# Patient Record
Sex: Female | Born: 1980 | Race: White | Hispanic: No | Marital: Married | Smoking: Never smoker
Health system: Southern US, Community
[De-identification: ages and names within clinical notes are randomized; demographics above are authoritative.]

## PROBLEM LIST (undated history)

## (undated) DIAGNOSIS — F419 Anxiety disorder, unspecified: Secondary | ICD-10-CM

## (undated) DIAGNOSIS — M1990 Unspecified osteoarthritis, unspecified site: Secondary | ICD-10-CM

## (undated) DIAGNOSIS — M5126 Other intervertebral disc displacement, lumbar region: Secondary | ICD-10-CM

## (undated) DIAGNOSIS — K589 Irritable bowel syndrome without diarrhea: Secondary | ICD-10-CM

## (undated) DIAGNOSIS — G629 Polyneuropathy, unspecified: Secondary | ICD-10-CM

## (undated) DIAGNOSIS — K219 Gastro-esophageal reflux disease without esophagitis: Secondary | ICD-10-CM

## (undated) DIAGNOSIS — E079 Disorder of thyroid, unspecified: Secondary | ICD-10-CM

## (undated) HISTORY — PX: BUNIONECTOMY: SHX129

## (undated) HISTORY — PX: BREAST SURGERY: SHX581

## (undated) HISTORY — PX: TONSILLECTOMY: SUR1361

## (undated) HISTORY — PX: COSMETIC SURGERY: SHX468

---

## 2007-08-22 ENCOUNTER — Ambulatory Visit (HOSPITAL_BASED_OUTPATIENT_CLINIC_OR_DEPARTMENT_OTHER): Admission: RE | Admit: 2007-08-22 | Discharge: 2007-08-23 | Payer: Self-pay | Admitting: Plastic Surgery

## 2007-08-22 ENCOUNTER — Encounter (INDEPENDENT_AMBULATORY_CARE_PROVIDER_SITE_OTHER): Payer: Self-pay | Admitting: Plastic Surgery

## 2008-10-04 HISTORY — PX: OTHER SURGICAL HISTORY: SHX169

## 2009-05-08 ENCOUNTER — Ambulatory Visit (HOSPITAL_COMMUNITY): Admission: RE | Admit: 2009-05-08 | Discharge: 2009-05-08 | Payer: Self-pay | Admitting: Podiatry

## 2011-01-10 LAB — HEMOGLOBIN AND HEMATOCRIT, BLOOD
HCT: 35.6 % — ABNORMAL LOW (ref 36.0–46.0)
Hemoglobin: 12.9 g/dL (ref 12.0–15.0)

## 2011-02-16 NOTE — H&P (Signed)
NAME:  Amy Wang, Amy Wang             ACCOUNT NO.:  [id number]   MEDICAL RECORD NO.:  [id number]          PATIENT TYPE:  AMB   LOCATION:  DAY                           FACILITY:  [facility]   PHYSICIAN:  Cody M. Ulice Brilliant, D.P.M.  DATE OF BIRTH:  27-Jun-1981   DATE OF ADMISSION:  DATE OF DISCHARGE:  LH                              HISTORY & PHYSICAL   HISTORY OF PRESENT ILLNESS:  This is a 30 year old white female who has  a painful bunion deformity of her right foot which been progressively  getting more sore over the last several years, now to the point where  all enclosed shoes are uncomfortable.  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  She is also basically on to wearing sandals now.   PAST MEDICAL HISTORY:  Significant for anxiety.   ALLERGIES:  No known drug allergies.   MEDICATION HISTORY:  Includes Xanax.   PAST SURGICAL HISTORY:  Includes previous breast reconstruction surgery  and childbirth.   SOCIAL HISTORY:  Admits to occasional alcohol use.  She denies the use  of tobacco or illegal drugs.  There is a family history of heart disease  with her father.  Her father is also diabetic.   PHYSICAL EXAMINATION:  GENERAL:  Patient is noted to be a friendly,  cooperative, white female who is slightly anxious.  She is noted to have  palpable dorsalis pedis and posterior tibial pulses.  She has a well-  defined hallux valgus deformity of the right foot.  The great toe is  abutting the second toe.  The medial eminence is prominent, palpable and  uncomfortable to deep palpation.  NEUROLOGICAL:  Examination is unremarkable.   RADIOGRAPHIC ANALYSIS:  Reveals an enlarged HAV deformity of the right  foot.  The IM angle is increased as well as the HA angle.  The medial  eminence is prominent.   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   PLAN:  She is to the point now where she does require surgery and all  enclosed shoes are uncomfortable.  She would like to proceed with this  at [facility].  We have this scheduled for Thursday, May 08, 2009,  under monitored anesthesia care.  I have described the procedure, the  anesthesia.  The usual postoperative course.  The possibility of  complications.  Amy Wang has read her consent form, has participated in the  discussion and to my knowledge has understood and signed.      Denny Peon. Ulice Brilliant, D.P.M.  Electronically Signed     CMD/MEDQ  D:  05/07/2009  T:  05/07/2009  Job:  [id number]

## 2011-02-16 NOTE — Op Note (Signed)
NAME:  Amy Wang, Amy Wang             ACCOUNT NO.:  [id number]   MEDICAL RECORD NO.:  [id number]          PATIENT TYPE:  AMB   LOCATION:  DSC                          FACILITY:  [facility]   PHYSICIAN:  Etter Sjogren, M.D.     DATE OF BIRTH:  1981/03/27   DATE OF PROCEDURE:  08/22/2007  DATE OF DISCHARGE:                               OPERATIVE REPORT   PREOPERATIVE DIAGNOSIS:  Macromastia.   POSTOPERATIVE DIAGNOSIS:  Macromastia.   PROCEDURE PERFORMED:  Bilateral breast reduction.   SURGEON:  Etter Sjogren, M.D.   ASSISTANT:  Pleas Patricia, M.D.   ANESTHESIA:  General.   ESTIMATED BLOOD LOSS:  225 mL.   DRAINS:  One Harrison Mons was left on each side.   CLINICAL NOTE:  30 year old woman with a large breasts desires breast  reduction.  Nature of procedure, risks of complications discussed with  her.  She complains of back pain and shoulder pain, neck pain and she  understands the risk of complications and wished to proceed.   DESCRIPTION OF PROCEDURE:  The patient placed full standing position in  holding area and marked for the breast reduction, taken to the operating  room, placed supine. After successful induction of general anesthesia  she was prepped with Betadine and draped with sterile drapes.  8 cm wide  inferior nipple areolar pedicles were designed and a 38-mm marker marked  a new nipple-areolar complex.  Incisions made, pedicles de-  epithelialized and were isolated from surrounding tissues using the  electrocautery, beveling in outward direction medially and laterally in  order to ensure a broader attachment at the level chest wall than was  present at level of the skin.  At the conclusion of this dissection the  nipple complex had excellent color, bright red bleeding around the  periphery consistent with viability.  Resections were performed medial,  central and lateral and a total of 445 grams from the right side 455  grams left side.  This seemed to give her very nice  symmetry.  Thorough  irrigation with saline.  Meticulous hemostasis with electrocautery.  Excellent hemostasis having been achieved the Blake drain was positioned  brought through the lateral aspect of the inframammary crease incision.  The closures with 3-0 Vicryl in interrupted inverted suture at the  inverted T position followed by 3-0 Monocryl interrupted inverted deep  dermal sutures and 4-0 Monocryl running subcuticular suture.  Measurement taken 5 cm up from the primary crease and a 30-mm marker  marked new nipple areolar site.  This tissue was resected.  Hemostasis  electrocautery.  The nipple complex brought through this opening and  found be viable with bright red bleeding around the periphery.  Insetting with 3-0  Monocryl interrupted inverted deep dermal sutures and running 4-0  Monocryl subcuticular suture.  Steri-Strips, Xeroform gauze, dry sterile  dressing and a circumferential Ace wrap applied and she was transferred  to the recovery room in stable condition having tolerated the procedure  well.      Etter Sjogren, M.D.  Electronically Signed     DB/MEDQ  D:  08/22/2007  T:  08/23/2007  Job:  [id number]

## 2011-02-16 NOTE — Op Note (Signed)
NAME:  Amy Wang, Amy Wang             ACCOUNT NO.:  [id number]   MEDICAL RECORD NO.:  [id number]          PATIENT TYPE:  AMB   LOCATION:  DAY                           FACILITY:  [facility]   PHYSICIAN:  Cody M. Ulice Brilliant, D.P.M.  DATE OF BIRTH:  1981-01-14   DATE OF PROCEDURE:  05/08/2009  DATE OF DISCHARGE:  05/08/2009                               OPERATIVE REPORT   PREOPERATIVE DIAGNOSES:  1. Hallux abductovalgus deformity, right foot.  2. Hallux abductus interphalangeal deformity, proximal phalanx right      foot.   POSTOPERATIVE DIAGNOSES:  1. Hallux abductovalgus deformity, right foot.  2. Hallux abductus interphalangeal deformity, proximal phalanx right      foot.   PROCEDURES PERFORMED:  1. Austin bunionectomy right foot.  2. Distal Akin osteotomy, proximal phalanx, right great toe.   SURGEON:  Denny Peon. Ulice Brilliant, DPM   ANESTHESIA:  MAC.   INDICATIONS FOR SURGERY:  Longstanding bunion deformity of right foot  getting progressively worse to the point now where all closed shoes were  uncomfortable.  Amy Wang also relates that the great toe is abutting the  second toe and she has discomfort about the distal medial aspect of the  great toe and she does not like the way the toe looks curving into the  second digit.  Clinically and radiographically, it is evident that she  has deformity in the great toe that is independent of the deformity at  the first MTP.  Radiographs do reveal a lateral bowing of the proximal  phalanx.   DESCRIPTION OF PROCEDURE:  Amy Wang is brought into the OR and placed on  the table in the supine position.  IV sedation was established.  A Mayo  block was performed about the first MTP of this right foot utilizing 16  mL of a 1:1 mixture of lidocaine and Marcaine.  A pneumatic ankle  tourniquet was then applied across her right ankle.  Her foot was  prepped and draped in the usual aseptic fashion.  An Ace bandage was  utilized to exsanguinate her foot.  The tourniquet  is inflated to 250  mmHg.   PROCEDURE:  1. Austin bunionectomy, right foot.  Attention was directed to the      first metatarsophalangeal joint of the right foot.  A 7-cm slightly      curvilinear skin incision was planned first with a skin marking pen      and then carried forth with a #15 blade.  The incision was deepened      through the subcutaneous tissues with care taken to avoid      neurovascular structures.  Care was taken to get a good exposure to      the medial aspect of the joint capsule.  An inverted-L capsulotomy      was then performed.  The capsular periosteal tissues were reflected      away from the medial eminence of the first metatarsal head.  Care      was taken also to get good exposure to the dorsal aspect of the      metatarsal along the dorsal  wing of the Austin osteotomy.  The      medial eminence was delivered from the surgical wound utilizing a      sagittal saw with a #63 blade.  Attention was then directed into      the first interspace through the original skin incision.  Utilizing      an EndoRetractor and Metzenbaum scissors, the dissection was      carried down to the level of the fibular sesamoid and abductor      hallucis tenotomy was then performed a #15 blade.  Tension was then      directed back to the first metatarsal head.  A 0.045 K-wire was      introduced and utilized to act as a pin axis guide.  The Amy Wang      cuts were then made with the sagittal saw utilizing the 63 blade.      The capital fragment was relocated laterally, approximately 30%      across the width of the metatarsal surface.  This amount of      correction was visualized with the Zeiss scan.  This was then      fixated with two 0.045 K-wires driven in a divergent fashion.  Care      was taken that there was no redundant protrusion of the K-wires      through the articular surface.  The osteotomy was deemed stable.      The redundant bone medially was then excised with the  saw and the      newly formed ostia surface was rasped smooth.  The K-wires were      then closed with the metatarsal surface cut and rotated, so that      they lay flush against the metatarsal surface.  The wound was      flushed with copious amounts of irrigant.  Capsular tissues were      then reapproximated across the vertical weighing of the      capsulotomy, redundant medial joint capsule was excised.  The      capsular tissues were then reapproximated and closed with 3-0      Vicryl.  Subcutaneous tissues were reapproximated along the length      of the incision utilizing 4-0 Vicryl in a running horizontal      mattress suture.  Skin was then closed with a subcuticular suture      of 4-0 Vicryl.  2. Distal Akin osteotomy of great toe, right foot.  Attention was then      directed to the medial aspect of the great toe.  A 2.5-cm incision      was made just distal to the IP joint extending approximately to      about the proximal aspect of the proximal phalanx.  The incision      was deepened via sharp and blunt dissection directly to the      proximal phalanx.  Utilizing a Therapist, nutritional, we were able to      visualize the distal surgical neck of the proximal phalanx.  The      first osteotomy cut was made parallel to the IP joint to about the      lateral cortical surface.  The second cut was made oblique to the      first with the base being medial and the apex lateral, delivering a      small wedge away from the bone.  The osteotomy was then further  driven laterally with pressure being put on the osteotomy to close      down the osteotomy with the lateral cortical surface weaken, so      that the osteotomy could be closed down.  The saw was removed and      the wound was flushed.  This osteotomy was then closed with two      crossed K-wires driven in a divergent fashion.  The first being      from dorsal distal medial to plantar lateral proximal.  The second      being from  proximal medial to lateral distal.  The osteotomy was      deemed very stable.  The Zeiss scan was utilized to ensure that the      K-wires were not crossing the IP joint.  Care was taken there was      no redundant protrusion of the K-wires in the proximal aspect.  The      K-wires were bent close to the skin in the distal aspect both      medial and lateral and cut.  Pin caps were applied across this.      The wound was flushed with copious amounts of irrigant.  Deep      fascia was closed with horizontal mattress sutures of 4-0 Vicryl.      Skin was then closed with 4-0 Prolene in a combination of      interrupted and horizontal mattress sutures.  Adaptic dressing was      applied to the K-wire exit sites as well as the incisions.  A dry      sterile compressive dressing then follows.  The tourniquet was then      deflated and the patient was removed off the tourniquet and      draping.  A stockinette dressing was applied over the surgical      dressing and a size medium Cam walker was applied to her foot.   Amy Wang tolerates the anesthesia and procedure well.  She was transported  to [facility] without incident.  While there, a list of written  instructions were all explained to her.  While she is being transported,  I am discussing her procedure and her postop care with her step-father.  A prescription for oxycodone 7.5/500 is dispensed as well as a  prescription for promethazine 25 mg.  She will be seen in 7 days for her  first postop dressing change.      Denny Peon. Ulice Brilliant, D.P.M.  Electronically Signed    CMD/MEDQ  D:  05/09/2009  T:  05/09/2009  Job:  [id number]

## 2011-02-25 ENCOUNTER — Emergency Department (HOSPITAL_COMMUNITY): Payer: 59

## 2011-02-25 ENCOUNTER — Emergency Department (HOSPITAL_COMMUNITY)
Admission: EM | Admit: 2011-02-25 | Discharge: 2011-02-25 | Disposition: A | Discharge status: Home / Self Care | Payer: 59 | Attending: Emergency Medicine | Admitting: Emergency Medicine

## 2011-02-25 DIAGNOSIS — R109 Unspecified abdominal pain: Secondary | ICD-10-CM | POA: Insufficient documentation

## 2011-02-25 DIAGNOSIS — R112 Nausea with vomiting, unspecified: Secondary | ICD-10-CM | POA: Insufficient documentation

## 2011-02-25 DIAGNOSIS — F411 Generalized anxiety disorder: Secondary | ICD-10-CM | POA: Insufficient documentation

## 2011-02-25 DIAGNOSIS — K219 Gastro-esophageal reflux disease without esophagitis: Secondary | ICD-10-CM | POA: Insufficient documentation

## 2011-02-25 DIAGNOSIS — R197 Diarrhea, unspecified: Secondary | ICD-10-CM | POA: Insufficient documentation

## 2011-02-25 LAB — URINALYSIS, ROUTINE W REFLEX MICROSCOPIC
Glucose, UA: NEGATIVE mg/dL
Protein, ur: NEGATIVE mg/dL
Specific Gravity, Urine: 1.025 (ref 1.005–1.030)
pH: 5.5 (ref 5.0–8.0)

## 2011-02-25 LAB — CBC
Hemoglobin: 13.1 g/dL (ref 12.0–15.0)
RBC: 4.12 MIL/uL (ref 3.87–5.11)
WBC: 8.5 10*3/uL (ref 4.0–10.5)

## 2011-02-25 LAB — DIFFERENTIAL
Basophils Absolute: 0 10*3/uL (ref 0.0–0.1)
Basophils Relative: 0 % (ref 0–1)
Neutro Abs: 6.5 10*3/uL (ref 1.7–7.7)
Neutrophils Relative %: 76 % (ref 43–77)

## 2011-02-25 LAB — COMPREHENSIVE METABOLIC PANEL
AST: 19 U/L (ref 0–37)
Albumin: 4.5 g/dL (ref 3.5–5.2)
Chloride: 102 mEq/L (ref 96–112)
Creatinine, Ser: 0.56 mg/dL (ref 0.4–1.2)
GFR calc Af Amer: >60 mL/min (ref >60)
Total Bilirubin: 0.4 mg/dL (ref 0.3–1.2)

## 2011-02-25 MED ORDER — IOHEXOL 300 MG/ML  SOLN
100.0000 mL | Freq: Once | INTRAMUSCULAR | Status: AC | PRN
  Administered 2011-02-25: 100 mL via INTRAVENOUS

## 2011-02-27 LAB — GC/CHLAMYDIA PROBE AMP, GENITAL: Chlamydia, DNA Probe: NEGATIVE

## 2011-07-13 LAB — POCT HEMOGLOBIN-HEMACUE: Operator id: 128471

## 2012-12-16 ENCOUNTER — Ambulatory Visit (INDEPENDENT_AMBULATORY_CARE_PROVIDER_SITE_OTHER): Payer: Commercial Managed Care - PPO | Admitting: Family Medicine

## 2012-12-16 VITALS — BP 120/80 | HR 108 | Temp 98.5°F | Resp 18 | Ht 68.5 in | Wt 224.0 lb

## 2012-12-16 DIAGNOSIS — J020 Streptococcal pharyngitis: Secondary | ICD-10-CM

## 2012-12-16 LAB — POCT CBC
Granulocyte percent: 83.8 %G — AB (ref 37–80)
HCT, POC: 40.3 % (ref 37.7–47.9)
Hemoglobin: 13 g/dL (ref 12.2–16.2)
Lymph, poc: 1.7 (ref 0.6–3.4)
MCH, POC: 31.3 pg — AB (ref 27–31.2)
MCHC: 32.3 g/dL (ref 31.8–35.4)
MCV: 97 fL (ref 80–97)
MID (cbc): 0.4 (ref 0–0.9)
MPV: 7.9 fL (ref 0–99.8)
POC Granulocyte: 11.2 — AB (ref 2–6.9)
POC LYMPH PERCENT: 12.9 %L (ref 10–50)
POC MID %: 3.3 %M (ref 0–12)
Platelet Count, POC: 242 10*3/uL (ref 142–424)
RBC: 4.15 M/uL (ref 4.04–5.48)
RDW, POC: 12.8 %
WBC: 13.4 10*3/uL — AB (ref 4.6–10.2)

## 2012-12-16 MED ORDER — OXYCODONE-ACETAMINOPHEN 5-325 MG PO TABS: 1.0000 | ORAL_TABLET | Freq: Three times a day (TID) | ORAL | Status: DC | PRN

## 2012-12-16 MED ORDER — CEFDINIR 300 MG PO CAPS: 300.0000 mg | ORAL_CAPSULE | Freq: Two times a day (BID) | ORAL | Status: DC

## 2012-12-16 MED ORDER — CEFTRIAXONE SODIUM 1 G IJ SOLR
1.0000 g | Freq: Once | INTRAMUSCULAR | Status: AC
Start: 2012-12-16 — End: 2012-12-16
  Administered 2012-12-16: 1 g via INTRAMUSCULAR

## 2012-12-16 MED ORDER — CEFTRIAXONE SODIUM 1 G IJ SOLR: 1.0000 g | INTRAMUSCULAR | Status: DC

## 2012-12-16 NOTE — Patient Instructions (Addendum)
Strep Infections  Streptococcal (strep) infections are caused by streptococcal germs (bacteria). Strep infections are very contagious. Strep infections can occur in:   Ears.   The nose.   The throat.   Sinuses.   Skin.   Blood.   Lungs.   Spinal fluid.   Urine.  Strep throat is the most common bacterial infection in children. The symptoms of a Strep infection usually get better in 2 to 3 days after starting medicine that kills germs (antibiotics). Strep is usually not contagious after 36 to 48 hours of antibiotic treatment. Strep infections that are not treated can cause serious complications. These include gland infections, throat abscess, rheumatic fever and kidney disease.  DIAGNOSIS   The diagnosis of strep is made by:   A culture for the strep germ.  TREATMENT   These infections require oral antibiotics for a full 10 days, an antibiotic shot or antibiotics given into the vein (intravenous, IV).  HOME CARE INSTRUCTIONS    Be sure to finish all antibiotics even if feeling better.   Only take over-the-counter medicines for pain, discomfort and or fever, as directed by your caregiver.   Close contacts that have a fever, sore throat or illness symptoms should see their caregiver right away.   You or your child may return to work, school or daycare if the fever and pain are better in 2 to 3 days after starting antibiotics.  SEEK MEDICAL CARE IF:    You or your child has an oral temperature above 102 F (38.9 C).   Your baby is older than 3 months with a rectal temperature of 100.5 F (38.1 C) or higher for more than 1 day.   You or your child is not better in 3 days.  SEEK IMMEDIATE MEDICAL CARE IF:    You or your child has an oral temperature above 102 F (38.9 C), not controlled by medicine.   Your baby is older than 3 months with a rectal temperature of 102 F (38.9 C) or higher.   Your baby is 3 months old or younger with a rectal temperature of 100.4 F (38 C) or higher.   There is a  spreading rash.   There is difficulty swallowing or breathing.   There is increased pain or swelling.  Document Released: 10/28/2004 Document Revised: 12/13/2011 Document Reviewed: 08/06/2009  Exit[employer] Patient Information 2013 [employer]

## 2012-12-16 NOTE — Progress Notes (Signed)
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 Patient is in tears complaining of severe pain in her throat. She denies a headache or stiff neck. She also has no cough or high fever.  Objective: Patient acute distress,tearful TMs: Normal Oropharynx: Reddened swollen posterior pharynx with no exudates Neck: Bilateral swollen anterior submandibular nodes which are tender but not fluctuant, neck is supple Chest: Clear: Skin: Warm and dry  Results for orders placed in visit on 12/16/12  POCT CBC      Result Value Range   WBC 13.4 (*) 4.6 - 10.2 K/uL   Lymph, poc 1.7  0.6 - 3.4   POC LYMPH PERCENT 12.9  10 - 50 %L   MID (cbc) 0.4  0 - 0.9   POC MID % 3.3  0 - 12 %M   POC Granulocyte 11.2 (*) 2 - 6.9   Granulocyte percent 83.8 (*) 37 - 80 %G   RBC 4.15  4.04 - 5.48 M/uL   Hemoglobin 13.0  12.2 - 16.2 g/dL   HCT, POC 08.6  57.8 - 47.9 %   MCV 97.0  80 - 97 fL   MCH, POC 31.3 (*) 27 - 31.2 pg   MCHC 32.3  31.8 - 35.4 g/dL   RDW, POC 46.9     Platelet Count, POC 242  142 - 424 K/uL   MPV 7.9  0 - 99.8 fL   Assessment: Progressive sinus congestion with acute pharyngitis and lymphadenopathy in the neck  Plan: Rocephin 1 g IM now Percocet 5 mg dose 325 every 6 when necessary Ceftin here 302 tablets daily Streptococcal sore throat - Plan: POCT CBC, Culture, Group A Strep, cefTRIAXone (ROCEPHIN) injection 1 g, cefdinir (OMNICEF) 300 MG capsule, oxyCODONE-acetaminophen (ROXICET) 5-325 MG per tablet

## 2012-12-18 LAB — CULTURE, GROUP A STREP

## 2013-01-02 DIAGNOSIS — M5126 Other intervertebral disc displacement, lumbar region: Secondary | ICD-10-CM

## 2013-01-02 DIAGNOSIS — M5136 Other intervertebral disc degeneration, lumbar region: Secondary | ICD-10-CM

## 2013-01-02 DIAGNOSIS — M51369 Other intervertebral disc degeneration, lumbar region without mention of lumbar back pain or lower extremity pain: Secondary | ICD-10-CM

## 2013-01-02 HISTORY — DX: Other intervertebral disc degeneration, lumbar region: M51.36

## 2013-01-02 HISTORY — DX: Other intervertebral disc degeneration, lumbar region without mention of lumbar back pain or lower extremity pain: M51.369

## 2013-01-02 HISTORY — DX: Other intervertebral disc displacement, lumbar region: M51.26

## 2013-01-18 ENCOUNTER — Encounter (HOSPITAL_COMMUNITY): Payer: Self-pay | Admitting: *Deleted

## 2013-01-18 ENCOUNTER — Emergency Department (HOSPITAL_COMMUNITY)
Admission: EM | Admit: 2013-01-18 | Discharge: 2013-01-18 | Disposition: A | Discharge status: Home / Self Care | Payer: 59 | Attending: Emergency Medicine | Admitting: Emergency Medicine

## 2013-01-18 ENCOUNTER — Emergency Department (HOSPITAL_COMMUNITY): Payer: 59

## 2013-01-18 DIAGNOSIS — M79609 Pain in unspecified limb: Secondary | ICD-10-CM | POA: Insufficient documentation

## 2013-01-18 DIAGNOSIS — IMO0002 Reserved for concepts with insufficient information to code with codable children: Secondary | ICD-10-CM | POA: Insufficient documentation

## 2013-01-18 DIAGNOSIS — F411 Generalized anxiety disorder: Secondary | ICD-10-CM | POA: Insufficient documentation

## 2013-01-18 DIAGNOSIS — Z3202 Encounter for pregnancy test, result negative: Secondary | ICD-10-CM | POA: Insufficient documentation

## 2013-01-18 DIAGNOSIS — M5416 Radiculopathy, lumbar region: Secondary | ICD-10-CM

## 2013-01-18 DIAGNOSIS — Z79899 Other long term (current) drug therapy: Secondary | ICD-10-CM | POA: Insufficient documentation

## 2013-01-18 HISTORY — DX: Anxiety disorder, unspecified: F41.9

## 2013-01-18 LAB — URINALYSIS, ROUTINE W REFLEX MICROSCOPIC
Bilirubin Urine: NEGATIVE
Glucose, UA: NEGATIVE mg/dL
Hgb urine dipstick: NEGATIVE
Specific Gravity, Urine: <1.005 — ABNORMAL LOW (ref 1.005–1.030)
pH: 5.5 (ref 5.0–8.0)

## 2013-01-18 LAB — CBC WITH DIFFERENTIAL/PLATELET
Eosinophils Absolute: 0.1 10*3/uL (ref 0.0–0.7)
Eosinophils Relative: 1 % (ref 0–5)
Hemoglobin: 12.3 g/dL (ref 12.0–15.0)
Lymphocytes Relative: 34 % (ref 12–46)
Lymphs Abs: 2.8 10*3/uL (ref 0.7–4.0)
MCH: 31.9 pg (ref 26.0–34.0)
MCV: 91.2 fL (ref 78.0–100.0)
Monocytes Relative: 5 % (ref 3–12)
RBC: 3.85 MIL/uL — ABNORMAL LOW (ref 3.87–5.11)

## 2013-01-18 LAB — BASIC METABOLIC PANEL
BUN: 18 mg/dL (ref 6–23)
CO2: 22 mEq/L (ref 19–32)
Calcium: 9.4 mg/dL (ref 8.4–10.5)
GFR calc non Af Amer: >90 mL/min (ref >90)
Glucose, Bld: 93 mg/dL (ref 70–99)
Potassium: 3.9 mEq/L (ref 3.5–5.1)

## 2013-01-18 MED ORDER — HYDROMORPHONE HCL PF 1 MG/ML IJ SOLN
1.0000 mg | Freq: Once | INTRAMUSCULAR | Status: AC
  Administered 2013-01-18: 1 mg via INTRAVENOUS
  Filled 2013-01-18: qty 1

## 2013-01-18 MED ORDER — CYCLOBENZAPRINE HCL 10 MG PO TABS
10.0000 mg | ORAL_TABLET | Freq: Once | ORAL | Status: AC
  Administered 2013-01-18: 10 mg via ORAL
  Filled 2013-01-18: qty 1

## 2013-01-18 MED ORDER — KETOROLAC TROMETHAMINE 30 MG/ML IJ SOLN
30.0000 mg | Freq: Once | INTRAMUSCULAR | Status: AC
  Administered 2013-01-18: 30 mg via INTRAVENOUS
  Filled 2013-01-18: qty 1

## 2013-01-18 MED ORDER — CYCLOBENZAPRINE HCL 10 MG PO TABS: 10.0000 mg | ORAL_TABLET | Freq: Three times a day (TID) | ORAL | Status: DC | PRN

## 2013-01-18 MED ORDER — PREDNISONE 10 MG PO TABS: ORAL_TABLET | ORAL | Status: DC

## 2013-01-18 MED ORDER — ONDANSETRON HCL 4 MG/2ML IJ SOLN
4.0000 mg | Freq: Once | INTRAMUSCULAR | Status: AC
  Administered 2013-01-18: 4 mg via INTRAVENOUS
  Filled 2013-01-18: qty 2

## 2013-01-18 MED ORDER — OXYCODONE-ACETAMINOPHEN 5-325 MG PO TABS: 1.0000 | ORAL_TABLET | ORAL | Status: DC | PRN

## 2013-01-18 NOTE — ED Notes (Signed)
Pt lying on abdomen on arrival to ED. States she is unable to move or walk at present.

## 2013-01-18 NOTE — ED Provider Notes (Signed)
History     CSN: [id number]  Arrival date & time 01/18/13  1747   First MD Initiated Contact with Patient 01/18/13 1755      Chief Complaint  Patient presents with  . Back Pain    (Consider location/radiation/quality/duration/timing/severity/associated sxs/prior treatment) HPI Comments: Patient with hx of occasional low back pain, c/o sudden onset of severe low back pain after she picked up her son.  States she felt a sharp pain to her lower back that radiated into her left anterior thigh and stops at the level of her knee.  Describes the pain as "feels like someone has a knife in my back".  She states that she has occasional episodes of low back pain and has been "down with my back before" but states she has never experienced pain similar to this.  She states the pain is worse with movement and nothing makes it better.  She has not tried any therapies at home.  She denies numbness or weakness of her legs, dysuria, incontinence of bladder or bowel, saddle anesthesia's or pain to her right leg.    Patient is a 32 y.o. female presenting with back pain. The history is provided by the patient.  Back Pain Location:  Lumbar spine Quality:  Shooting and aching Radiates to:  L posterior upper leg, L knee and L thigh Pain severity:  Severe Pain is:  Same all the time Onset quality:  Sudden Timing:  Constant Progression:  Unchanged Chronicity:  New Context: lifting heavy objects   Relieved by:  Nothing Worsened by:  Ambulation, bending, movement, standing, twisting, sitting and palpation Ineffective treatments:  None tried Associated symptoms: leg pain   Associated symptoms: no abdominal pain, no abdominal swelling, no bladder incontinence, no bowel incontinence, no chest pain, no dysuria, no fever, no headaches, no numbness, no paresthesias, no pelvic pain, no perianal numbness, no tingling and no weakness     Past Medical History  Diagnosis Date  . Anxiety     Past Surgical History    Procedure Laterality Date  . Breast surgery    . Cosmetic surgery      Family History  Problem Relation Age of Onset  . Heart disease Father     History  Substance Use Topics  . Smoking status: Never Smoker   . Smokeless tobacco: Not on file  . Alcohol Use: Yes    OB History   Grav Para Term Preterm Abortions TAB SAB Ect Mult Living                  Review of Systems  Constitutional: Negative for fever, activity change and appetite change.  HENT: Negative for neck pain.   Respiratory: Negative for shortness of breath.   Cardiovascular: Negative for chest pain.  Gastrointestinal: Negative for vomiting, abdominal pain, constipation and bowel incontinence.  Genitourinary: Negative for bladder incontinence, dysuria, hematuria, flank pain, decreased urine volume, vaginal bleeding, vaginal discharge, difficulty urinating, vaginal pain and pelvic pain.       No perineal numbness or incontinence of urine or feces  Musculoskeletal: Positive for back pain. Negative for joint swelling.  Skin: Negative for rash.  Neurological: Negative for dizziness, tingling, weakness, numbness, headaches and paresthesias.  Psychiatric/Behavioral: The patient is nervous/anxious.   All other systems reviewed and are negative.    Allergies  Review of patient's allergies indicates no known allergies.  Home Medications   Current Outpatient Rx  Name  Route  Sig  Dispense  Refill  . ALPRAZolam (  XANAX) 1 MG tablet   Oral   Take 1 mg by mouth 3 (three) times daily as needed for anxiety.          . citalopram (CELEXA) 10 MG tablet   Oral   Take 10 mg by mouth daily.           BP 123/86  Pulse 87  Temp(Src) 97.3 F (36.3 C) (Oral)  Resp 20  SpO2 100%  LMP 01/16/2013  Physical Exam  Nursing note and vitals reviewed. Constitutional: She is oriented to person, place, and time. She appears well-developed and well-nourished.  Patient is tearful and appears uncomfortable, lying in prone  position on the stretcher.    HENT:  Head: Normocephalic and atraumatic.  Neck: Normal range of motion. Neck supple.  Cardiovascular: Normal rate, regular rhythm, normal heart sounds and intact distal pulses.   No murmur heard. Pulmonary/Chest: Effort normal and breath sounds normal. No respiratory distress.  Abdominal: Soft. She exhibits no distension. There is no tenderness. There is no rebound.  Genitourinary: Rectal exam shows no external hemorrhoid, no mass, no tenderness and anal tone normal.  Musculoskeletal: She exhibits tenderness. She exhibits no edema.       Lumbar back: She exhibits tenderness and pain. She exhibits normal range of motion, no bony tenderness, no swelling, no deformity, no laceration and normal pulse.       Back:  ttp of the lower lumbar spine , paraspinal muscles and SI joint space.  DP pulses brisk and symmetrical, distal sensation intact.  No calf pain or edema bilaterally  Neurological: She is alert and oriented to person, place, and time. No cranial nerve deficit or sensory deficit. She exhibits normal muscle tone. Coordination and gait normal.  Reflex Scores:      Patellar reflexes are 2+ on the right side and 2+ on the left side.      Achilles reflexes are 2+ on the right side and 2+ on the left side. Skin: Skin is warm and dry.    ED Course  Procedures (including critical care time)  Labs Reviewed  URINALYSIS, ROUTINE W REFLEX MICROSCOPIC - Abnormal; Notable for the following:    Color, Urine STRAW (*)    Specific Gravity, Urine <1.005 (*)    All other components within normal limits  CBC WITH DIFFERENTIAL - Abnormal; Notable for the following:    RBC 3.85 (*)    HCT 35.1 (*)    All other components within normal limits  BASIC METABOLIC PANEL  POCT PREGNANCY, URINE   Dg Lumbar Spine Complete  01/18/2013  *RADIOLOGY REPORT*  Clinical Data: Severe low back pain.  LUMBAR SPINE - COMPLETE 4+ VIEW  Comparison: CT scan of the abdomen and pelvis dated  02/25/2011  Findings: There is no fracture, subluxation, facet arthritis, disc space narrowing, or other abnormality.  IMPRESSION: Normal lumbar spine.   Original Report Authenticated By: Francene Boyers, M.D.         MDM    Patient was given Dilaudid IV en route by EMS, had been given additional dilaudid, zofran, toradol and flexeril during ED stay. Continues to report no relief from the medications.   Pain is reproduced with palp of the lumbar spine and left SI joint with radicular pain to the left leg to level of the knee.  Concerning for radiculopathy, ordered x-ray and labs.   2135  Pt findings discussed with the EDP who will also evaluate the patient.  Attempted to ambulate the patient, but she  only took one step then states her pain is "too bad to walk".   2305  Patient is feeling better.  States that her pain is now "toleratable".  She is moving both LE's w/o difficulty, reflexes are intact and symmetrical, no sensory deficits, able to flex great toe bilaterally, no bladder or bowel incontinence. On initial exam pt c/o pain radiating to her left leg, she now points to her right medial thigh, but is stating the pain is more centered in her lower pelvic area and feels "like a band".  Rectal tone is nml (husband present in the room as chaparone) no rectal masses. Patient was able to elevate her hips off the stretcher for rectal exam.   abd remains soft and NT w/o guarding or rebound.  Care plan discussed with the EDP.  I have arranged for the patient to return here tomorrow at 10:00 am for MRI L spine.  She agrees to care plan and verbalized understanding.     Percocet pre-pack dispensed.  Will prescribe prednisone taper, flexeril and percocet.  Pt also given referral info for [facility].   The patient appears reasonably screened and/or stabilized for discharge and I doubt any other medical condition or other [facility] requiring further screening, evaluation, or treatment in the ED  at this time prior to discharge.    Ioan Landini L. Ashly Yepez, PA-C 01/19/13 0106

## 2013-01-18 NOTE — ED Notes (Signed)
Patient states that pain subsided when she laid down after about 10 minutes.

## 2013-01-18 NOTE — ED Notes (Signed)
Pt was bending over to pick up child and pain began suddenly. Lower back pain radiating down right let. Pt received a total of 2mg  of Dilaudid IV en route by EMS with minimal relief.

## 2013-01-19 ENCOUNTER — Ambulatory Visit (HOSPITAL_COMMUNITY)
Admit: 2013-01-19 | Discharge: 2013-01-19 | Disposition: A | Discharge status: Home / Self Care | Payer: 59 | Source: Ambulatory Visit | Attending: Emergency Medicine | Admitting: Emergency Medicine

## 2013-01-19 DIAGNOSIS — M5126 Other intervertebral disc displacement, lumbar region: Secondary | ICD-10-CM | POA: Insufficient documentation

## 2013-01-19 DIAGNOSIS — M545 Low back pain, unspecified: Secondary | ICD-10-CM | POA: Insufficient documentation

## 2013-01-19 NOTE — ED Provider Notes (Signed)
Pt returned for her MRI this am.  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  Pt very uncomfortable despite meds prescribed here last night.  She was offered re-register here for pain control.  Deferred.  Encouraged call to [facility] for further management of her treatment, referral given last night.  Husband states will call for this referral.  Advised return here for any worsened sx or if pain is not improving at home with rest,  Medications,  Ice/heat therapy.  Burgess Amor, PA-C 01/19/13 1157

## 2013-01-20 NOTE — ED Provider Notes (Signed)
Medical screening examination/treatment/procedure(s) were conducted as a shared visit with non-physician practitioner(s) and myself.  I personally evaluated the patient during the encounter.  Lower back pain with radiation to the left lower extremity. No bowel or bladder incontinence. MRI scheduled for the following day  Donnetta Hutching, MD 01/20/13 0025

## 2013-01-22 MED FILL — Oxycodone w/ Acetaminophen Tab 5-325 MG: ORAL | Qty: 6 | Status: AC

## 2013-01-25 NOTE — ED Provider Notes (Signed)
Medical screening examination/treatment/procedure(s) were conducted as a shared visit with non-physician practitioner(s) and myself.  I personally evaluated the patient during the encounter.  Patient evaluated by self last night.  MRI results as above.  Donnetta Hutching, MD 01/25/13 0800

## 2013-02-21 ENCOUNTER — Other Ambulatory Visit: Payer: Self-pay | Admitting: Neurosurgery

## 2013-02-21 DIAGNOSIS — M549 Dorsalgia, unspecified: Secondary | ICD-10-CM

## 2013-02-23 ENCOUNTER — Other Ambulatory Visit: Payer: Self-pay | Admitting: Neurosurgery

## 2013-02-23 ENCOUNTER — Ambulatory Visit
Admission: RE | Admit: 2013-02-23 | Discharge: 2013-02-23 | Disposition: A | Discharge status: Home / Self Care | Payer: 59 | Source: Ambulatory Visit | Attending: Neurosurgery | Admitting: Neurosurgery

## 2013-02-23 DIAGNOSIS — M549 Dorsalgia, unspecified: Secondary | ICD-10-CM

## 2013-02-23 MED ORDER — IOHEXOL 180 MG/ML  SOLN
1.0000 mL | Freq: Once | INTRAMUSCULAR | Status: AC | PRN
  Administered 2013-02-23: 1 mL via EPIDURAL

## 2013-02-23 MED ORDER — METHYLPREDNISOLONE ACETATE 40 MG/ML INJ SUSP (RADIOLOG
120.0000 mg | Freq: Once | INTRAMUSCULAR | Status: AC
  Administered 2013-02-23: 120 mg via EPIDURAL

## 2013-02-27 ENCOUNTER — Other Ambulatory Visit: Payer: 59

## 2013-09-11 ENCOUNTER — Encounter (INDEPENDENT_AMBULATORY_CARE_PROVIDER_SITE_OTHER): Payer: Self-pay | Admitting: *Deleted

## 2013-10-08 ENCOUNTER — Telehealth (INDEPENDENT_AMBULATORY_CARE_PROVIDER_SITE_OTHER): Payer: Self-pay | Admitting: *Deleted

## 2013-10-08 ENCOUNTER — Other Ambulatory Visit (INDEPENDENT_AMBULATORY_CARE_PROVIDER_SITE_OTHER): Payer: Self-pay | Admitting: *Deleted

## 2013-10-08 ENCOUNTER — Encounter (INDEPENDENT_AMBULATORY_CARE_PROVIDER_SITE_OTHER): Payer: Self-pay | Admitting: *Deleted

## 2013-10-08 DIAGNOSIS — Z8371 Family history of colonic polyps: Secondary | ICD-10-CM

## 2013-10-08 DIAGNOSIS — Z1211 Encounter for screening for malignant neoplasm of colon: Secondary | ICD-10-CM

## 2013-10-08 NOTE — Telephone Encounter (Signed)
Patient needs movi prep 

## 2013-10-09 MED ORDER — PEG-KCL-NACL-NASULF-NA ASC-C 100 G PO SOLR: 1.0000 | Freq: Once | ORAL | Status: DC

## 2013-10-23 ENCOUNTER — Telehealth (INDEPENDENT_AMBULATORY_CARE_PROVIDER_SITE_OTHER): Payer: Self-pay | Admitting: *Deleted

## 2013-10-23 NOTE — Telephone Encounter (Signed)
  Procedure: tcs  Reason/Indication:  Screening, fam hx polyps  Has patient had this procedure before?  no  If so, when, by whom and where?    Is there a family history of colon cancer?  no  Who?  What age when diagnosed?    Is patient diabetic?   no      Does patient have prosthetic heart valve?  no  Do you have a pacemaker?  no  Has patient ever had endocarditis? no  Has patient had joint replacement within last 12 months?  no  Does patient tend to be constipated or take laxatives? no  Is patient on Coumadin, Plavix and/or Aspirin? no  Medications: citalopram 10 mg daily, alprazolam 1 mg up to tid prn, ranitidine 150 mg prn  Allergies: nkda  Medication Adjustment:   Procedure date & time: 11/22/13 at 1030

## 2013-10-23 NOTE — Telephone Encounter (Signed)
agree

## 2013-11-12 ENCOUNTER — Encounter (HOSPITAL_COMMUNITY): Payer: Self-pay | Admitting: Pharmacy Technician

## 2013-11-22 ENCOUNTER — Encounter (HOSPITAL_COMMUNITY)
Admission: RE | Disposition: A | Discharge status: Home / Self Care | Payer: Self-pay | Source: Ambulatory Visit | Attending: Internal Medicine

## 2013-11-22 ENCOUNTER — Encounter (HOSPITAL_COMMUNITY): Payer: Self-pay | Admitting: *Deleted

## 2013-11-22 ENCOUNTER — Ambulatory Visit (HOSPITAL_COMMUNITY)
Admission: RE | Admit: 2013-11-22 | Discharge: 2013-11-22 | Disposition: A | Discharge status: Home / Self Care | Payer: 59 | Source: Ambulatory Visit | Attending: Internal Medicine | Admitting: Internal Medicine

## 2013-11-22 DIAGNOSIS — K644 Residual hemorrhoidal skin tags: Secondary | ICD-10-CM | POA: Insufficient documentation

## 2013-11-22 DIAGNOSIS — Z8371 Family history of colonic polyps: Secondary | ICD-10-CM

## 2013-11-22 DIAGNOSIS — F411 Generalized anxiety disorder: Secondary | ICD-10-CM | POA: Insufficient documentation

## 2013-11-22 DIAGNOSIS — R197 Diarrhea, unspecified: Secondary | ICD-10-CM | POA: Insufficient documentation

## 2013-11-22 DIAGNOSIS — Z1211 Encounter for screening for malignant neoplasm of colon: Secondary | ICD-10-CM

## 2013-11-22 DIAGNOSIS — Z83719 Family history of colon polyps, unspecified: Secondary | ICD-10-CM | POA: Insufficient documentation

## 2013-11-22 HISTORY — DX: Other intervertebral disc displacement, lumbar region: M51.26

## 2013-11-22 HISTORY — DX: Unspecified osteoarthritis, unspecified site: M19.90

## 2013-11-22 HISTORY — DX: Gastro-esophageal reflux disease without esophagitis: K21.9

## 2013-11-22 HISTORY — PX: COLONOSCOPY: SHX5424

## 2013-11-22 SURGERY — COLONOSCOPY
Anesthesia: Moderate Sedation

## 2013-11-22 MED ORDER — MIDAZOLAM HCL 5 MG/5ML IJ SOLN
INTRAMUSCULAR | Status: AC
  Filled 2013-11-22: qty 5

## 2013-11-22 MED ORDER — MIDAZOLAM HCL 5 MG/5ML IJ SOLN
INTRAMUSCULAR | Status: DC | PRN
  Administered 2013-11-22 (×2): 2 mg via INTRAVENOUS
  Administered 2013-11-22: 4 mg via INTRAVENOUS
  Administered 2013-11-22 (×4): 3 mg via INTRAVENOUS

## 2013-11-22 MED ORDER — STERILE WATER FOR IRRIGATION IR SOLN
Status: DC | PRN
  Administered 2013-11-22: 11:00:00

## 2013-11-22 MED ORDER — MIDAZOLAM HCL 5 MG/5ML IJ SOLN
INTRAMUSCULAR | Status: AC
  Filled 2013-11-22: qty 10

## 2013-11-22 MED ORDER — MEPERIDINE HCL 50 MG/ML IJ SOLN
INTRAMUSCULAR | Status: AC
  Filled 2013-11-22: qty 1

## 2013-11-22 MED ORDER — PROMETHAZINE HCL 25 MG/ML IJ SOLN
INTRAMUSCULAR | Status: AC
  Filled 2013-11-22: qty 1

## 2013-11-22 MED ORDER — MEPERIDINE HCL 50 MG/ML IJ SOLN
INTRAMUSCULAR | Status: DC | PRN
  Administered 2013-11-22: 50 mg
  Administered 2013-11-22 (×2): 25 mg

## 2013-11-22 MED ORDER — SODIUM CHLORIDE 0.9 % IJ SOLN
INTRAMUSCULAR | Status: AC
  Filled 2013-11-22: qty 10

## 2013-11-22 MED ORDER — SODIUM CHLORIDE 0.9 % IV SOLN
INTRAVENOUS | Status: DC
  Administered 2013-11-22: 1000 mL via INTRAVENOUS

## 2013-11-22 MED ORDER — PROMETHAZINE HCL 25 MG/ML IJ SOLN
INTRAMUSCULAR | Status: DC | PRN
  Administered 2013-11-22 (×2): 12.5 mg via INTRAVENOUS

## 2013-11-22 MED ORDER — DICYCLOMINE HCL 10 MG PO CAPS: 10.0000 mg | ORAL_CAPSULE | Freq: Three times a day (TID) | ORAL | Status: DC

## 2013-11-22 NOTE — H&P (Signed)
Amy Wang is an 33 y.o. female.   Chief Complaint: Patient is here for colonoscopy. HPI: Patient is 33 year old Caucasian female who has a son age 74 with multiple polyps removed. Patient was therefore advised to get her colon checked. She complains of diarrhea which he's had for at least a year. She passes liquid stools. She has urgency and cramps. She notices blood at times but is only tissue and no frank bleeding. Appetite is good and she has not lost any weight. 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 Family history is negative for colonic polyps or CRC.   Past Medical History  Diagnosis Date  . Anxiety   . GERD (gastroesophageal reflux disease)   . Arthritis     back  . 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 Breast surgery    . Cosmetic surgery    . Bunionectomy Right   . Tummy tuck  2010  . Tonsillectomy      as child    Family History  Problem Relation Age of Onset  . Heart disease Father   . Diabetes type II Father   . Transient ischemic attack Mother   . Colon polyps Son    Social History:  reports that she has never smoked. She does not have any smokeless tobacco history on file. She reports that she drinks alcohol. She reports that she does not use illicit drugs.  Allergies: No Known Allergies  Medications Prior to Admission  Medication Sig Dispense Refill  . ALPRAZolam (XANAX) 1 MG tablet Take 1 mg by mouth 3 (three) times daily as needed for anxiety.       . citalopram (CELEXA) 10 MG tablet Take 10 mg by mouth daily.      . cyclobenzaprine (FLEXERIL) 10 MG tablet Take 10 mg by mouth 3 (three) times daily as needed for muscle spasms.      Marland Kitchen oxyCODONE-acetaminophen (PERCOCET) 7.5-325 MG per tablet Take 1 tablet by mouth every 4 (four) hours as needed for pain.      . peg 3350 powder (MOVIPREP) 100 G SOLR Take 1 kit (200 g total) by mouth once.  1 kit  0  . ranitidine (ZANTAC) 150 MG capsule Take 150  mg by mouth 2 (two) times daily as needed for heartburn.        No results found for this or any previous visit (from the past 48 hour(s)). No results found.  ROS  Blood pressure 139/100, pulse 102, temperature 98.1 F (36.7 C), temperature source Oral, resp. rate 20, height _0  (1.778 m), weight 190 lb (86.183 kg), last menstrual period 11/12/2013, SpO2 99.00%. Physical Exam  Constitutional: She appears well-developed and well-nourished.  HENT:  Mouth/Throat: Oropharynx is clear and moist.  Eyes: Conjunctivae are normal. No scleral icterus.  Neck: No thyromegaly present.  Cardiovascular: Normal rate, regular rhythm and normal heart sounds.   No murmur heard. Respiratory: Effort normal and breath sounds normal.  GI: Soft. She exhibits no distension and no mass. There is no tenderness.  Musculoskeletal: She exhibits no edema.  Lymphadenopathy:    She has no cervical adenopathy.  Neurological: She is alert.  Skin: Skin is warm and dry.     Assessment/Plan History of multiple polyps and a son age 5. Chronic diarrhea. Screening colonoscopy.  REHMAN,NAJEEB U 11/22/2013, 10:21 AM

## 2013-11-22 NOTE — Op Note (Signed)
COLONOSCOPY PROCEDURE REPORT  PATIENT:  Amy Wang  MR#:  [id number] Birthdate:  12/30/1980, 33 y.o., female Endoscopist:  Dr. Rogene Houston, MD Referred By:  Dr. Matthias Hughs, MD  Procedure Date: 11/22/2013  Procedure:   Colonoscopy  Indications:  Patient is 34 year old Caucasian female whose son age 52 was found to have multiple colonic polyps. She was therefore advised to be checked. She says her husband had a colonoscopy and was normal. Family she is negative for CRC. She does history of chronic diarrhea.  Informed Consent:  The procedure and risks were reviewed with the patient and informed consent was obtained.  Medications:  Demerol 100 mg IV Versed 20 mg IV Promethazine 25 mg IV and diluted form.  Description of procedure:  After a digital rectal exam was performed, that colonoscope was advanced from the anus through the rectum and colon to the area of the cecum, ileocecal valve and appendiceal orifice. The cecum was deeply intubated. These structures were well-seen and photographed for the record. From the level of the cecum and ileocecal valve, the scope was slowly and cautiously withdrawn. The mucosal surfaces were carefully surveyed utilizing scope tip to flexion to facilitate fold flattening as needed. The scope was pulled down into the rectum where a thorough exam including retroflexion was performed. Terminal ileum was also examined.  Findings:   Prep excellent. Normal mucosa of terminal ileum. Normal mucosa cecum and rest of the colon. Random biopsies taken for mucosa of sigmoid colon. Normal mucosa of rectum. Small hemorrhoids below the dentate line and two anal papillae.   Therapeutic/Diagnostic Maneuvers Performed:  See above  Complications:  None  Cecal Withdrawal Time:  8 minutes  Impression:  Normal terminal ileoscopy. Normal colonoscopy up except small external hemorrhoids and two anal papilla. Random biopsies taken for mucosa of sigmoid colon  looking for microscopic colitis.  Comment; Suspect patient has IBS; topsy taken from mucosa of sigmoid colon to rule out microscopic colitis.  Recommendations:  Standard instructions given. Cyclomen 10 mg by mouth for each meal. I will contact patient with biopsy results and further recommendations.  Calvert Charland U  11/22/2013 11:09 AM  CC: Dr. Deloria Lair, MD & Dr. Rayne Du ref. provider found

## 2013-11-22 NOTE — Discharge Instructions (Signed)
Resume usual medications and diet. Dicyclomine 10 mg by mouth 30 minutes before each meal daily. No driving for 24 hours. Physician will contact you with biopsy results. Colonoscopy, Care After Refer to this sheet in the next few weeks. These instructions provide you with information on caring for yourself after your procedure. Your health care provider may also give you more specific instructions. Your treatment has been planned according to current medical practices, but problems sometimes occur. Call your health care provider if you have any problems or questions after your procedure. WHAT TO EXPECT AFTER THE PROCEDURE  After your procedure, it is typical to have the following:  A small amount of blood in your stool.  Moderate amounts of gas and mild abdominal cramping or bloating. HOME CARE INSTRUCTIONS  Do not drive, operate machinery, or sign important documents for 24 hours.  You may shower and resume your regular physical activities, but move at a slower pace for the first 24 hours.  Take frequent rest periods for the first 24 hours.  Walk around or put a warm pack on your abdomen to help reduce abdominal cramping and bloating.  Drink enough fluids to keep your urine clear or pale yellow.  You may resume your normal diet as instructed by your health care provider. Avoid heavy or fried foods that are hard to digest.  Avoid drinking alcohol for 24 hours or as instructed by your health care provider.  Only take over-the-counter or prescription medicines as directed by your health care provider.  If a tissue sample (biopsy) was taken during your procedure:  Do not take aspirin or blood thinners for 7 days, or as instructed by your health care provider.  Do not drink alcohol for 7 days, or as instructed by your health care provider.  Eat soft foods for the first 24 hours. SEEK MEDICAL CARE IF: You have persistent spotting of blood in your stool 2 3 days after the  procedure. SEEK IMMEDIATE MEDICAL CARE IF:  You have more than a small spotting of blood in your stool.  You pass large blood clots in your stool.  Your abdomen is swollen (distended).  You have nausea or vomiting.  You have a fever.  You have increasing abdominal pain that is not relieved with medicine. Document Released: 05/04/2004 Document Revised: 07/11/2013 Document Reviewed: 05/28/2013 [facility] Patient Information 2014 [place].

## 2013-11-26 ENCOUNTER — Encounter (HOSPITAL_COMMUNITY): Payer: Self-pay | Admitting: Internal Medicine

## 2013-12-04 ENCOUNTER — Encounter (INDEPENDENT_AMBULATORY_CARE_PROVIDER_SITE_OTHER): Payer: Self-pay | Admitting: *Deleted

## 2013-12-07 ENCOUNTER — Ambulatory Visit (HOSPITAL_COMMUNITY)
Admission: RE | Admit: 2013-12-07 | Discharge: 2013-12-07 | Disposition: A | Discharge status: Home / Self Care | Payer: 59 | Source: Ambulatory Visit | Attending: Neurosurgery | Admitting: Neurosurgery

## 2013-12-07 ENCOUNTER — Other Ambulatory Visit (HOSPITAL_COMMUNITY): Payer: Self-pay | Admitting: Neurosurgery

## 2013-12-07 DIAGNOSIS — M545 Low back pain, unspecified: Secondary | ICD-10-CM | POA: Insufficient documentation

## 2013-12-07 DIAGNOSIS — M549 Dorsalgia, unspecified: Secondary | ICD-10-CM

## 2013-12-07 DIAGNOSIS — M5137 Other intervertebral disc degeneration, lumbosacral region: Secondary | ICD-10-CM | POA: Insufficient documentation

## 2013-12-07 DIAGNOSIS — M51379 Other intervertebral disc degeneration, lumbosacral region without mention of lumbar back pain or lower extremity pain: Secondary | ICD-10-CM | POA: Insufficient documentation

## 2013-12-07 DIAGNOSIS — M5126 Other intervertebral disc displacement, lumbar region: Secondary | ICD-10-CM | POA: Insufficient documentation

## 2013-12-07 DIAGNOSIS — M79609 Pain in unspecified limb: Secondary | ICD-10-CM | POA: Insufficient documentation

## 2014-05-07 ENCOUNTER — Telehealth (INDEPENDENT_AMBULATORY_CARE_PROVIDER_SITE_OTHER): Payer: Self-pay | Admitting: *Deleted

## 2014-05-07 ENCOUNTER — Ambulatory Visit (INDEPENDENT_AMBULATORY_CARE_PROVIDER_SITE_OTHER): Payer: 59 | Admitting: Internal Medicine

## 2014-05-07 NOTE — Telephone Encounter (Signed)
Amy Wang cancelled her apt for today, 05/07/14, with Deberah Castle, NP. She is sick and will call back to reschedule.

## 2014-07-23 ENCOUNTER — Emergency Department (HOSPITAL_COMMUNITY)
Admission: EM | Admit: 2014-07-23 | Discharge: 2014-07-23 | Disposition: A | Discharge status: Home / Self Care | Payer: 59 | Attending: Emergency Medicine | Admitting: Emergency Medicine

## 2014-07-23 ENCOUNTER — Emergency Department (HOSPITAL_COMMUNITY): Payer: 59

## 2014-07-23 ENCOUNTER — Encounter (HOSPITAL_COMMUNITY): Payer: Self-pay | Admitting: Emergency Medicine

## 2014-07-23 DIAGNOSIS — M479 Spondylosis, unspecified: Secondary | ICD-10-CM | POA: Diagnosis not present

## 2014-07-23 DIAGNOSIS — Z3202 Encounter for pregnancy test, result negative: Secondary | ICD-10-CM | POA: Insufficient documentation

## 2014-07-23 DIAGNOSIS — F419 Anxiety disorder, unspecified: Secondary | ICD-10-CM | POA: Insufficient documentation

## 2014-07-23 DIAGNOSIS — Z79899 Other long term (current) drug therapy: Secondary | ICD-10-CM | POA: Diagnosis not present

## 2014-07-23 DIAGNOSIS — K219 Gastro-esophageal reflux disease without esophagitis: Secondary | ICD-10-CM | POA: Diagnosis not present

## 2014-07-23 DIAGNOSIS — K589 Irritable bowel syndrome without diarrhea: Secondary | ICD-10-CM

## 2014-07-23 DIAGNOSIS — R1010 Upper abdominal pain, unspecified: Secondary | ICD-10-CM | POA: Diagnosis present

## 2014-07-23 DIAGNOSIS — R109 Unspecified abdominal pain: Secondary | ICD-10-CM

## 2014-07-23 HISTORY — DX: Irritable bowel syndrome, unspecified: K58.9

## 2014-07-23 LAB — URINALYSIS, ROUTINE W REFLEX MICROSCOPIC
Bilirubin Urine: NEGATIVE
GLUCOSE, UA: NEGATIVE mg/dL
HGB URINE DIPSTICK: NEGATIVE
Ketones, ur: NEGATIVE mg/dL
LEUKOCYTES UA: NEGATIVE
Nitrite: NEGATIVE
PH: 5.5 (ref 5.0–8.0)
PROTEIN: NEGATIVE mg/dL
Specific Gravity, Urine: 1.015 (ref 1.005–1.030)
Urobilinogen, UA: 0.2 mg/dL (ref 0.0–1.0)

## 2014-07-23 LAB — CBC WITH DIFFERENTIAL/PLATELET
Basophils Absolute: 0.1 10*3/uL (ref 0.0–0.1)
Basophils Relative: 1 % (ref 0–1)
Eosinophils Absolute: 1 10*3/uL — ABNORMAL HIGH (ref 0.0–0.7)
Eosinophils Relative: 9 % — ABNORMAL HIGH (ref 0–5)
HCT: 37.9 % (ref 36.0–46.0)
HEMOGLOBIN: 13.4 g/dL (ref 12.0–15.0)
Lymphocytes Relative: 35 % (ref 12–46)
Lymphs Abs: 3.8 10*3/uL (ref 0.7–4.0)
MCH: 31.8 pg (ref 26.0–34.0)
MCHC: 35.4 g/dL (ref 30.0–36.0)
MCV: 90 fL (ref 78.0–100.0)
MONO ABS: 0.7 10*3/uL (ref 0.1–1.0)
Monocytes Relative: 7 % (ref 3–12)
NEUTROS ABS: 5.1 10*3/uL (ref 1.7–7.7)
NEUTROS PCT: 48 % (ref 43–77)
Platelets: 280 10*3/uL (ref 150–400)
RBC: 4.21 MIL/uL (ref 3.87–5.11)
RDW: 12 % (ref 11.5–15.5)
WBC: 10.7 10*3/uL — AB (ref 4.0–10.5)

## 2014-07-23 LAB — PREGNANCY, URINE: PREG TEST UR: NEGATIVE

## 2014-07-23 LAB — COMPREHENSIVE METABOLIC PANEL
ALT: 26 U/L (ref 0–35)
ANION GAP: 10 (ref 5–15)
AST: 22 U/L (ref 0–37)
Albumin: 4.3 g/dL (ref 3.5–5.2)
Alkaline Phosphatase: 108 U/L (ref 39–117)
BILIRUBIN TOTAL: 0.3 mg/dL (ref 0.3–1.2)
BUN: 11 mg/dL (ref 6–23)
CHLORIDE: 101 meq/L (ref 96–112)
CO2: 29 mEq/L (ref 19–32)
Calcium: 9.8 mg/dL (ref 8.4–10.5)
Creatinine, Ser: 0.72 mg/dL (ref 0.50–1.10)
GFR calc non Af Amer: >90 mL/min (ref >90)
GLUCOSE: 100 mg/dL — AB (ref 70–99)
POTASSIUM: 5.1 meq/L (ref 3.7–5.3)
SODIUM: 140 meq/L (ref 137–147)
TOTAL PROTEIN: 8 g/dL (ref 6.0–8.3)

## 2014-07-23 LAB — AMYLASE: Amylase: 38 U/L (ref 0–105)

## 2014-07-23 LAB — LIPASE, BLOOD: Lipase: 31 U/L (ref 11–59)

## 2014-07-23 MED ORDER — MORPHINE SULFATE 4 MG/ML IJ SOLN
4.0000 mg | INTRAMUSCULAR | Status: DC | PRN
  Administered 2014-07-23: 4 mg via INTRAVENOUS
  Filled 2014-07-23: qty 1

## 2014-07-23 MED ORDER — IOHEXOL 300 MG/ML  SOLN
25.0000 mL | Freq: Once | INTRAMUSCULAR | Status: AC | PRN
  Administered 2014-07-23: 25 mL via ORAL

## 2014-07-23 MED ORDER — ONDANSETRON 4 MG PO TBDP: 4.0000 mg | ORAL_TABLET | Freq: Three times a day (TID) | ORAL | Status: DC | PRN

## 2014-07-23 MED ORDER — HYOSCYAMINE SULFATE 0.125 MG SL SUBL: 0.1250 mg | SUBLINGUAL_TABLET | Freq: Four times a day (QID) | SUBLINGUAL | Status: DC | PRN

## 2014-07-23 MED ORDER — ONDANSETRON HCL 4 MG/2ML IJ SOLN
4.0000 mg | Freq: Once | INTRAMUSCULAR | Status: AC
  Administered 2014-07-23: 4 mg via INTRAVENOUS
  Filled 2014-07-23: qty 2

## 2014-07-23 MED ORDER — GLYCOPYRROLATE 0.2 MG/ML IJ SOLN
0.3000 mg | Freq: Once | INTRAMUSCULAR | Status: AC
  Administered 2014-07-23: 0.3 mg via INTRAVENOUS
  Filled 2014-07-23: qty 2

## 2014-07-23 MED ORDER — IOHEXOL 300 MG/ML  SOLN
100.0000 mL | Freq: Once | INTRAMUSCULAR | Status: AC | PRN
  Administered 2014-07-23: 100 mL via INTRAVENOUS

## 2014-07-23 MED ORDER — HYDROCODONE-ACETAMINOPHEN 5-325 MG PO TABS: 2.0000 | ORAL_TABLET | ORAL | Status: DC | PRN

## 2014-07-23 NOTE — ED Notes (Signed)
MD at bedside. 

## 2014-07-23 NOTE — ED Provider Notes (Signed)
CSN: [id number]     Arrival date & time 07/23/14  1459 History  This chart was scribed for Amy Furry, MD by Evelene Croon, ED Scribe. This patient was seen in room [facility]07 and the patient's care was started 5:23 PM.    Chief Complaint  Patient presents with  . Abdominal Pain      The history is provided by the patient. No language interpreter was used.    HPI Comments:  Amy Wang is a 33 y.o. female who presents to the Emergency Department complaining of intermittent upper abdominal pain for about 2 weeks, which has been more frequent for the past 4 days. She reports associated nausea and diarrhea. She likens the pain to contractions and notes that the pain waxes and wanes in severity. She notes pain is exacerbated whenever she eats but has not noticed a pattern with greasy foods, states pain occurs when she eats anything. She had a similar episode about 1 year ago, was told by PCP it may have been IBS but was not officially diagnosed. She denies fever and vomiting.  She has been taking bentyl without relief. She also denies a h/o cholecystectomy and appendectomy.    Past Medical History  Diagnosis Date  . Anxiety   . GERD (gastroesophageal reflux disease)   . Arthritis     back  . Bulging lumbar disc 01/2013  . IBS (irritable bowel syndrome)    Past Surgical History  Procedure Laterality Date  . Breast surgery    . Cosmetic surgery    . Bunionectomy Right   . Tummy tuck  2010  . Tonsillectomy      as child  . Colonoscopy N/A 11/22/2013    Procedure: COLONOSCOPY;  Surgeon: Rogene Houston, MD;  Location: [facility];  Service: Endoscopy;  Laterality: N/A;  1030   Family History  Problem Relation Age of Onset  . Heart disease Father   . Diabetes type II Father   . Transient ischemic attack Mother   . Colon polyps Son    History  Substance Use Topics  . Smoking status: Never Smoker   . Smokeless tobacco: Not on file  . Alcohol Use: Yes     Comment: social    OB History   Grav Para Term Preterm Abortions TAB SAB Ect Mult Living                 Review of Systems  Constitutional: Negative for fever.  Gastrointestinal: Positive for nausea, abdominal pain and diarrhea. Negative for vomiting.  All other systems reviewed and are negative.     Allergies  Scopolamine  Home Medications   Prior to Admission medications   Medication Sig Start Date End Date Taking? Authorizing Provider  ALPRAZolam Duanne Moron) 1 MG tablet Take 1 mg by mouth 3 (three) times daily as needed for anxiety.    Yes Historical Provider, MD  CALCIUM PO Take 2 tablets by mouth every morning.   Yes Historical Provider, MD  cyclobenzaprine (FLEXERIL) 10 MG tablet Take 10 mg by mouth 3 (three) times daily as needed for muscle spasms.   Yes Historical Provider, MD  dicyclomine (BENTYL) 10 MG capsule Take 1 capsule (10 mg total) by mouth 3 (three) times daily before meals. 11/22/13  Yes Rogene Houston, MD  DULoxetine (CYMBALTA) 30 MG capsule Take 90 mg by mouth every morning.   Yes Historical Provider, MD  L-LYSINE PO Take 2 tablets by mouth every morning.   Yes Historical Provider,  MD  metoprolol succinate (TOPROL-XL) 25 MG 24 hr tablet Take 25 mg by mouth daily.   Yes Historical Provider, MD  Multiple Vitamin (MULTIVITAMIN WITH MINERALS) TABS tablet Take 1 tablet by mouth daily.   Yes Historical Provider, MD  oxyCODONE-acetaminophen (PERCOCET) 7.5-325 MG per tablet Take 1 tablet by mouth every 4 (four) hours as needed for pain.   Yes Historical Provider, MD  ranitidine (ZANTAC) 150 MG capsule Take 150 mg by mouth 2 (two) times daily as needed for heartburn.   Yes Historical Provider, MD  HYDROcodone-acetaminophen (NORCO/VICODIN) 5-325 MG per tablet Take 2 tablets by mouth every 4 (four) hours as needed. 07/23/14   Amy Furry, MD  hyoscyamine (LEVSIN/SL) 0.125 MG SL tablet Place 1 tablet (0.125 mg total) under the tongue every 6 (six) hours as needed for cramping. 07/23/14   Amy Furry, MD  ondansetron (ZOFRAN ODT) 4 MG disintegrating tablet Take 1 tablet (4 mg total) by mouth every 8 (eight) hours as needed for nausea. 07/23/14   Amy Furry, MD   BP 124/71  Pulse 86  Temp(Src) 99 F (37.2 C) (Oral)  Ht 5\' 9"  (1.753 m)  Wt 230 lb (104.327 kg)  BMI 33.95 kg/m2  SpO2 100%  LMP 07/13/2014 Physical Exam  Nursing note and vitals reviewed. Constitutional: She is oriented to person, place, and time. She appears well-developed and well-nourished. No distress.  HENT:  Head: Normocephalic.  Eyes: Conjunctivae are normal. Pupils are equal, round, and reactive to light. No scleral icterus.  Neck: Normal range of motion. Neck supple. No thyromegaly present.  Cardiovascular: Normal rate and regular rhythm.  Exam reveals no gallop and no friction rub.   No murmur heard. Pulmonary/Chest: Effort normal and breath sounds normal. No respiratory distress. She has no wheezes. She has no rales.  Abdominal: Soft. Bowel sounds are normal. She exhibits no distension. There is no tenderness. There is no rebound.  Indicates upper abdomen bilaterally. Non-tender on exam  Musculoskeletal: Normal range of motion.  Neurological: She is alert and oriented to person, place, and time.  Skin: Skin is warm and dry. No rash noted.  Psychiatric: She has a normal mood and affect. Her behavior is normal.    ED Course  Procedures  DIAGNOSTIC STUDIES:  Oxygen Saturation is 99% on RA, normal by my interpretation.    COORDINATION OF CARE:  5:33 PM Discussed treatment plan with pt at bedside and pt agreed to plan.  Labs Review Labs Reviewed  CBC WITH DIFFERENTIAL - Abnormal; Notable for the following:    WBC 10.7 (*)    Eosinophils Relative 9 (*)    Eosinophils Absolute 1.0 (*)    All other components within normal limits  COMPREHENSIVE METABOLIC PANEL - Abnormal; Notable for the following:    Glucose, Bld 100 (*)    All other components within normal limits  URINALYSIS, ROUTINE W  REFLEX MICROSCOPIC - Abnormal; Notable for the following:    APPearance HAZY (*)    All other components within normal limits  AMYLASE  LIPASE, BLOOD  PREGNANCY, URINE    Imaging Review Ct Abdomen Pelvis W Contrast  07/23/2014   CLINICAL DATA:  Mid abdominal pain for 1 week and have. Worsening abdominal pain today, diarrhea for 2 days  EXAM: CT ABDOMEN AND PELVIS WITH CONTRAST  TECHNIQUE: Multidetector CT imaging of the abdomen and pelvis was performed using the standard protocol following bolus administration of intravenous contrast.  CONTRAST:  68mL OMNIPAQUE IOHEXOL 300 MG/ML SOLN, 157mL OMNIPAQUE IOHEXOL  300 MG/ML SOLN  COMPARISON:  02/25/2011  FINDINGS: Lung bases are unremarkable. Sagittal images of the spine shows disc space flattening with vacuum disc phenomenon at L5-S1 level. Mild hepatic fatty infiltration. Gallbladder is contracted without evidence of calcified gallstones. The pancreas, spleen and adrenal glands are unremarkable. Kidneys are symmetrical in size and enhancement. No hydronephrosis or hydroureter.  Abdominal aorta is unremarkable.  No small bowel obstruction.  No ascites or free air.  No adenopathy.  There is no pericecal inflammation. Normal appendix. The terminal ileum is unremarkable.  The uterus is unremarkable. No adnexal masses noted. The urinary bladder is unremarkable. No destructive bony lesions are noted within pelvis. No inguinal adenopathy.  IMPRESSION: 1. No acute inflammatory process within abdomen or pelvis. 2. Mild hepatic fatty infiltration. 3. No pericecal inflammation.  Normal appendix. 4. No hydronephrosis or hydroureter.   Electronically Signed   By: Lahoma Crocker M.D.   On: 07/23/2014 19:14     EKG Interpretation None      MDM   Final diagnoses:  Abdominal pain, unspecified abdominal location  Irritable bowel syndrome   Patient improved greatly with it is spasmodic. CT scan shows no structural abnormalities. No elevation of white blood cell count.  Normal hepatobiliary and pancreatic enzymes. Normal urine. Plan GI followup. Fiber. Levsin for irritible bowel syndrome.  I personally performed the services described in this documentation, which was scribed in my presence. The recorded information has been reviewed and is accurate.    Amy Furry, MD 07/23/14 2027

## 2014-07-23 NOTE — Discharge Instructions (Signed)
Abdominal Pain Many things can cause abdominal pain. Usually, abdominal pain is not caused by a disease and will improve without treatment. It can often be observed and treated at home. Your health care provider will do a physical exam and possibly order blood tests and X-rays to help determine the seriousness of your pain. However, in many cases, more time must pass before a clear cause of the pain can be found. Before that point, your health care provider may not know if you need more testing or further treatment. HOME CARE INSTRUCTIONS  Monitor your abdominal pain for any changes. The following actions may help to alleviate any discomfort you are experiencing:  Only take over-the-counter or prescription medicines as directed by your health care provider.  Do not take laxatives unless directed to do so by your health care provider.  Try a clear liquid diet (broth, tea, or water) as directed by your health care provider. Slowly move to a bland diet as tolerated. SEEK MEDICAL CARE IF:  You have unexplained abdominal pain.  You have abdominal pain associated with nausea or diarrhea.  You have pain when you urinate or have a bowel movement.  You experience abdominal pain that wakes you in the night.  You have abdominal pain that is worsened or improved by eating food.  You have abdominal pain that is worsened with eating fatty foods.  You have a fever. SEEK IMMEDIATE MEDICAL CARE IF:   Your pain does not go away within 2 hours.  You keep throwing up (vomiting).  Your pain is felt only in portions of the abdomen, such as the right side or the left lower portion of the abdomen.  You pass bloody or black tarry stools. MAKE SURE YOU:  Understand these instructions.   Will watch your condition.   Will get help right away if you are not doing well or get worse.  Document Released: 06/30/2005 Document Revised: 09/25/2013 Document Reviewed: 05/30/2013 [employer] Patient Information  2015 [employer]. This information is not intended to replace advice given to you by your health care provider. Make sure you discuss any questions you have with your health care provider.   Irritable Bowel Syndrome Irritable bowel syndrome (IBS) is caused by a disturbance of normal bowel function and is a common digestive disorder. You may also hear this condition called spastic colon, mucous colitis, and irritable colon. There is no cure for IBS. However, symptoms often gradually improve or disappear with a good diet, stress management, and medicine. This condition usually appears in late adolescence or early adulthood. Women develop it twice as often as men. CAUSES  After food has been digested and absorbed in the small intestine, waste material is moved into the large intestine, or colon. In the colon, water and salts are absorbed from the undigested products coming from the small intestine. The remaining residue, or fecal material, is held for elimination. Under normal circumstances, gentle, rhythmic contractions of the bowel walls push the fecal material along the colon toward the rectum. In IBS, however, these contractions are irregular and poorly coordinated. The fecal material is either retained too long, resulting in constipation, or expelled too soon, producing diarrhea. SIGNS AND SYMPTOMS  The most common symptom of IBS is abdominal pain. It is often in the lower left side of the abdomen, but it may occur anywhere in the abdomen. The pain comes from spasms of the bowel muscles happening too much and from the buildup of gas and fecal material in the colon.   This pain:  Can range from sharp abdominal cramps to a dull, continuous ache.  Often worsens soon after eating.  Is often relieved by having a bowel movement or passing gas. Abdominal pain is usually accompanied by constipation, but it may also produce diarrhea. The diarrhea often occurs right after a meal or upon waking up in the  morning. The stools are often soft, watery, and flecked with mucus. Other symptoms of IBS include:  Bloating.  Loss of appetite.  Heartburn.  Backache.  Dull pain in the arms or shoulders.  Nausea.  Burping.  Vomiting.  Gas. IBS may also cause symptoms that are unrelated to the digestive system, such as:  Fatigue.  Headaches.  Anxiety.  Shortness of breath.  Trouble concentrating.  Dizziness. These symptoms tend to come and go. DIAGNOSIS  The symptoms of IBS may seem like symptoms of other, more serious digestive disorders. Your health care provider may want to perform tests to exclude these disorders.  TREATMENT Many medicines are available to help correct bowel function or relieve bowel spasms and abdominal pain. Among the medicines available are:  Laxatives for severe constipation and to help restore normal bowel habits.  Specific antidiarrheal medicines to treat severe or lasting diarrhea.  Antispasmodic agents to relieve intestinal cramps. Your health care provider may also decide to treat you with a mild tranquilizer or sedative during unusually stressful periods in your life. Your health care provider may also prescribe antidepressant medicine. The use of this medicine has been shown to reduce pain and other symptoms of IBS. Remember that if any medicine is prescribed for you, you should take it exactly as directed. Make sure your health care provider knows how well it worked for you. HOME CARE INSTRUCTIONS   Take all medicines as directed by your health care provider.  Avoid foods that are high in fat or oils, such as heavy cream, butter, frankfurters, sausage, and other fatty meats.  Avoid foods that make you go to the bathroom, such as fruit, fruit juice, and dairy products.  Cut out carbonated drinks, chewing gum, and "gassy" foods such as beans and cabbage. This may help relieve bloating and burping.  Eat foods with bran, and drink plenty of liquids  with the bran foods. This helps relieve constipation.  Keep track of what foods seem to bring on your symptoms.  Avoid emotionally charged situations or circumstances that produce anxiety.  Start or continue exercising.  Get plenty of rest and sleep. Document Released: 09/20/2005 Document Revised: 09/25/2013 Document Reviewed: 05/10/2008 [employer] Patient Information 2015 [employer]. This information is not intended to replace advice given to you by your health care provider. Make sure you discuss any questions you have with your health care provider.  

## 2014-07-23 NOTE — ED Notes (Signed)
Mid abd pain for 1.5 weeks, worse today, nausea, no vomiting. Diarrhea for last 2 days, a lot of gas.

## 2014-07-23 NOTE — ED Notes (Signed)
Pt alert & oriented x4, stable gait. Patient given discharge instructions, paperwork & prescription(s). Patient  instructed to stop at the registration desk to finish any additional paperwork. Patient verbalized understanding. Pt left department w/ no further questions. 

## 2014-09-02 ENCOUNTER — Emergency Department (HOSPITAL_COMMUNITY): Payer: 59

## 2014-09-02 ENCOUNTER — Encounter (HOSPITAL_COMMUNITY): Payer: Self-pay | Admitting: *Deleted

## 2014-09-02 ENCOUNTER — Emergency Department (HOSPITAL_COMMUNITY)
Admission: EM | Admit: 2014-09-02 | Discharge: 2014-09-02 | Disposition: A | Discharge status: Home / Self Care | Payer: 59 | Attending: Emergency Medicine | Admitting: Emergency Medicine

## 2014-09-02 DIAGNOSIS — Y998 Other external cause status: Secondary | ICD-10-CM | POA: Diagnosis not present

## 2014-09-02 DIAGNOSIS — M1990 Unspecified osteoarthritis, unspecified site: Secondary | ICD-10-CM | POA: Diagnosis not present

## 2014-09-02 DIAGNOSIS — F419 Anxiety disorder, unspecified: Secondary | ICD-10-CM | POA: Insufficient documentation

## 2014-09-02 DIAGNOSIS — S99912A Unspecified injury of left ankle, initial encounter: Secondary | ICD-10-CM | POA: Diagnosis present

## 2014-09-02 DIAGNOSIS — Y9389 Activity, other specified: Secondary | ICD-10-CM | POA: Insufficient documentation

## 2014-09-02 DIAGNOSIS — K219 Gastro-esophageal reflux disease without esophagitis: Secondary | ICD-10-CM | POA: Diagnosis not present

## 2014-09-02 DIAGNOSIS — W19XXXA Unspecified fall, initial encounter: Secondary | ICD-10-CM

## 2014-09-02 DIAGNOSIS — Z79899 Other long term (current) drug therapy: Secondary | ICD-10-CM | POA: Insufficient documentation

## 2014-09-02 DIAGNOSIS — M5416 Radiculopathy, lumbar region: Secondary | ICD-10-CM | POA: Diagnosis not present

## 2014-09-02 DIAGNOSIS — Y929 Unspecified place or not applicable: Secondary | ICD-10-CM | POA: Insufficient documentation

## 2014-09-02 DIAGNOSIS — S93402A Sprain of unspecified ligament of left ankle, initial encounter: Secondary | ICD-10-CM | POA: Diagnosis not present

## 2014-09-02 DIAGNOSIS — T1490XA Injury, unspecified, initial encounter: Secondary | ICD-10-CM

## 2014-09-02 DIAGNOSIS — Z23 Encounter for immunization: Secondary | ICD-10-CM | POA: Diagnosis not present

## 2014-09-02 DIAGNOSIS — W1839XA Other fall on same level, initial encounter: Secondary | ICD-10-CM | POA: Diagnosis not present

## 2014-09-02 LAB — POC URINE PREG, ED: Preg Test, Ur: NEGATIVE

## 2014-09-02 MED ORDER — IBUPROFEN 600 MG PO TABS: 600.0000 mg | ORAL_TABLET | Freq: Four times a day (QID) | ORAL | Status: DC | PRN

## 2014-09-02 MED ORDER — HYDROMORPHONE HCL 1 MG/ML IJ SOLN
1.0000 mg | Freq: Once | INTRAMUSCULAR | Status: AC
  Administered 2014-09-02: 1 mg via INTRAMUSCULAR
  Filled 2014-09-02: qty 1

## 2014-09-02 MED ORDER — DEXAMETHASONE SODIUM PHOSPHATE 10 MG/ML IJ SOLN
10.0000 mg | Freq: Once | INTRAMUSCULAR | Status: AC
  Administered 2014-09-02: 10 mg via INTRAMUSCULAR
  Filled 2014-09-02: qty 1

## 2014-09-02 MED ORDER — HYDROMORPHONE HCL 2 MG/ML IJ SOLN
2.0000 mg | Freq: Once | INTRAMUSCULAR | Status: AC
  Administered 2014-09-02: 2 mg via INTRAMUSCULAR
  Filled 2014-09-02: qty 1

## 2014-09-02 MED ORDER — OXYCODONE-ACETAMINOPHEN 10-325 MG PO TABS: 1.0000 | ORAL_TABLET | ORAL | Status: DC | PRN

## 2014-09-02 MED ORDER — OXYCODONE-ACETAMINOPHEN 5-325 MG PO TABS
1.0000 | ORAL_TABLET | Freq: Once | ORAL | Status: AC
  Administered 2014-09-02: 1 via ORAL
  Filled 2014-09-02: qty 1

## 2014-09-02 NOTE — ED Provider Notes (Signed)
CSN: [id number]     Arrival date & time 09/02/14  4098 History   First MD Initiated Contact with Patient 09/02/14 [phone]     Chief Complaint  Patient presents with  . Ankle Injury     (Consider location/radiation/quality/duration/timing/severity/associated sxs/prior Treatment) Patient is a 33 y.o. female presenting with lower extremity injury. The history is provided by the patient.  Ankle Injury Associated symptoms include arthralgias and joint swelling. Pertinent negatives include no abdominal pain, chest pain, fever, numbness, rash or weakness.   Amy Wang is a 33 y.o. female presenting with left ankle pain which occurred suddenly when she twisted the ankle while walking this am. She describes chronic low back pain with intermittent flares which cause severe pain with radiation into the left leg.  This am she was walking in her garage with a backpack on her back getting ready for work when she had a sharp stab of low back pain causing her to fall with left ankle inversion.    Pain in both her back and her ankle is sharp, constant and worse with palpation, movement and weight bearing.  The patient was able to weight bear immediately after the event.  There is no radiation of pain and the patient denies new numbness distal to the injury site, although reports chronic numbness in her left great toe, not worsened today.  She denies urinary or fecal retention or incontinence.  She has had no medication prior to arrival.  She has been evaluated by Dr Joya Salm for her back issues, diagnosed with degenerative lumbar disease but was determined to not be a surgical candidate due to her young age.  Pt is very tearful and frustrated due to her chronic pain and inability to get assistance with this problem.  She has not discussed this problem with her pcp.  She last saw Dr. Joya Salm last spring.   Past Medical History  Diagnosis Date  . Anxiety   . GERD (gastroesophageal reflux disease)   . Arthritis      back  . Bulging lumbar disc 01/2013  . IBS (irritable bowel syndrome)    Past Surgical History  Procedure Laterality Date  . Breast surgery    . Cosmetic surgery    . Bunionectomy Right   . Tummy tuck  2010  . Tonsillectomy      as child  . Colonoscopy N/A 11/22/2013    Procedure: COLONOSCOPY;  Surgeon: Rogene Houston, MD;  Location: [facility];  Service: Endoscopy;  Laterality: N/A;  1030   Family History  Problem Relation Age of Onset  . Heart disease Father   . Diabetes type II Father   . Transient ischemic attack Mother   . Colon polyps Son    History  Substance Use Topics  . Smoking status: Never Smoker   . Smokeless tobacco: Not on file  . Alcohol Use: Yes     Comment: social   OB History    No data available     Review of Systems  Constitutional: Negative for fever.  Respiratory: Negative for shortness of breath.   Cardiovascular: Negative for chest pain and leg swelling.  Gastrointestinal: Negative for abdominal pain, constipation and abdominal distention.  Genitourinary: Negative for dysuria, urgency, frequency, flank pain and difficulty urinating.  Musculoskeletal: Positive for back pain, joint swelling and arthralgias. Negative for gait problem.  Skin: Negative for rash and wound.  Neurological: Negative for weakness and numbness.      Allergies  Scopolamine  Home  Medications   Prior to Admission medications   Medication Sig Start Date End Date Taking? Authorizing Provider  ALPRAZolam Duanne Moron) 1 MG tablet Take 1 mg by mouth 3 (three) times daily as needed for anxiety.    Yes Historical Provider, MD  CALCIUM PO Take 2 tablets by mouth every morning.   Yes Historical Provider, MD  cyclobenzaprine (FLEXERIL) 10 MG tablet Take 10 mg by mouth 3 (three) times daily as needed for muscle spasms.   Yes Historical Provider, MD  dicyclomine (BENTYL) 10 MG capsule Take 1 capsule (10 mg total) by mouth 3 (three) times daily before meals. 11/22/13  Yes Rogene Houston, MD  DULoxetine (CYMBALTA) 30 MG capsule Take 90 mg by mouth every morning.   Yes Historical Provider, MD  HYDROcodone-acetaminophen (NORCO/VICODIN) 5-325 MG per tablet Take 2 tablets by mouth every 4 (four) hours as needed. 07/23/14  Yes [name], MD  hyoscyamine (LEVSIN/SL) 0.125 MG SL tablet Place 1 tablet (0.125 mg total) under the tongue every 6 (six) hours as needed for cramping. 07/23/14  Yes [name], MD  L-LYSINE PO Take 2 tablets by mouth every morning.   Yes Historical Provider, MD  metoprolol succinate (TOPROL-XL) 25 MG 24 hr tablet Take 25 mg by mouth daily.   Yes Historical Provider, MD  Multiple Vitamin (MULTIVITAMIN WITH MINERALS) TABS tablet Take 1 tablet by mouth daily.   Yes Historical Provider, MD  ondansetron (ZOFRAN ODT) 4 MG disintegrating tablet Take 1 tablet (4 mg total) by mouth every 8 (eight) hours as needed for nausea. 07/23/14  Yes [name], MD  ranitidine (ZANTAC) 150 MG capsule Take 150 mg by mouth 2 (two) times daily as needed for heartburn.   Yes Historical Provider, MD  ibuprofen (ADVIL,MOTRIN) 600 MG tablet Take 1 tablet (600 mg total) by mouth every 6 (six) hours as needed. 09/02/14   Evalee Jefferson, PA-C  oxyCODONE-acetaminophen (PERCOCET) 10-325 MG per tablet Take 1 tablet by mouth every 4 (four) hours as needed for pain. 09/02/14   Evalee Jefferson, PA-C   BP 133/82 mmHg  Pulse 90  Temp(Src) 98.3 F (36.8 C) (Oral)  Resp 16  Ht 5\' 9"  (1.753 m)  Wt 240 lb (108.863 kg)  BMI 35.43 kg/m2  SpO2 100%  LMP 08/06/2014 Physical Exam  Constitutional: She appears well-developed and well-nourished.  HENT:  Head: Normocephalic and atraumatic.  Cardiovascular: Normal rate and intact distal pulses.  Exam reveals no decreased pulses.   Pulses:      Dorsalis pedis pulses are 2+ on the right side, and 2+ on the left side.  Musculoskeletal: She exhibits edema and tenderness.       Left ankle: She exhibits decreased range of motion and swelling. She exhibits no  ecchymosis, no deformity and normal pulse. Tenderness. Lateral malleolus and proximal fibula tenderness found. No head of 5th metatarsal tenderness found. Achilles tendon normal.       Lumbar back: She exhibits tenderness. She exhibits no bony tenderness, no swelling, no edema and no deformity.  Pt has lumbar and left paralumbar ttp.  Neurological: She is alert. No sensory deficit.  Skin: Skin is warm, dry and intact.  Nursing note and vitals reviewed.   ED Course  Procedures (including critical care time) Labs Review Labs Reviewed  POC URINE PREG, ED    Imaging Review Dg Lumbar Spine Complete  09/02/2014   CLINICAL DATA:  Pain after falling from step  EXAM: LUMBAR SPINE - COMPLETE 4+ VIEW  COMPARISON:  Lumbar  spine series January 18, 2013; lumbar MRI December 07, 2013  FINDINGS: Frontal, lateral, spot lumbosacral lateral, and bilateral oblique views were obtained. There are 5 non-rib-bearing lumbar type vertebral bodies. There is no fracture or spondylolisthesis. There is minimal narrowing at L4-5. Other disc spaces appear normal. There is no appreciable facet arthropathy.  IMPRESSION: Minimal disc space narrowing L4-5. Other disc spaces appear normal. Study otherwise unremarkable.   Electronically Signed   By: Lowella Grip M.D.   On: 09/02/2014 09:37   Dg Ankle Complete Left  09/02/2014   CLINICAL DATA:  Pain and swelling laterally following fall  EXAM: LEFT ANKLE COMPLETE - 3+ VIEW  COMPARISON:  None.  FINDINGS: Frontal, oblique, and lateral views were obtained. There is marked swelling laterally. There is milder swelling medially. No fracture or joint effusion is appreciable. The ankle mortise appears.  IMPRESSION: Soft tissue swelling, marked laterally. No fracture. This degree of soft tissue swelling may have associated ligamentous injury. Ankle mortise appears intact on this series.   Electronically Signed   By: Lowella Grip M.D.   On: 09/02/2014 08:28   Dg Knee Complete 4 Views  Left  09/02/2014   CLINICAL DATA:  Status post fall this morning with persistent left lateral knee pain  EXAM: LEFT KNEE - COMPLETE 4+ VIEW  COMPARISON:  None.  FINDINGS: The bones of the knee are adequately mineralized. There is no acute fracture nor dislocation. There is no significant degenerative change. A small suprapatellar effusion may be present.  IMPRESSION: There is no acute bony abnormality of the left knee.   Electronically Signed   By: David  Martinique   On: 09/02/2014 09:37     EKG Interpretation None      MDM   Final diagnoses:  Ankle sprain, left, initial encounter  Fall  Chronic lumbar radiculopathy   Patients labs and/or radiological studies were viewed and considered during the medical decision making and disposition process. Pt was given dilaudid 1 mg IM with no relief from her pain, worsened after return from xray.  She takes oxycodone 7.5 mg chronically, suspect high pain tolerance.  Give dilaudid 2 mg IM after xrays with continued pain, but improving.  She was placed in ASO, crutches supplied. She expressed concern over coordination for crutch use, suggested walker if she cannot tolerate the crutches.  RICE,  Suggested f/u with pcp this week for recheck of her ankle sprain, also for further management of her chronic back pain.  She may benefit from a second opinion, asked pt to discuss with pcp.  She states he is not aware she has chronic daily back pain and that it does adversely affect her daily activities.  No neuro deficit on exam or by history to suggest emergent or surgical presentation.  Also discussed worsened sx that should prompt immediate re-evaluation including distal weakness, bowel/bladder retention/incontinence.        Evalee Jefferson, PA-C 09/02/14 1716  Maudry Diego, MD 09/03/14 [phone]

## 2014-09-02 NOTE — Discharge Instructions (Signed)
Ankle Sprain An ankle sprain is an injury to the strong, fibrous tissues (ligaments) that hold your ankle bones together.  HOME CARE   Put ice on your ankle for 1-2 days or as told by your doctor.  Put ice in a plastic bag.  Place a towel between your skin and the bag.  Leave the ice on for 15-20 minutes at a time, every 2 hours while you are awake.  Only take medicine as told by your doctor.  Raise (elevate) your injured ankle above the level of your heart as much as possible for 2-3 days.  Use crutches if your doctor tells you to. Slowly put your own weight on the affected ankle. Use the crutches until you can walk without pain.  If you have a plaster splint:  Do not rest it on anything harder than a pillow for 24 hours.  Do not put weight on it.  Do not get it wet.  Take it off to shower or bathe.  If given, use an elastic wrap or support stocking for support. Take the wrap off if your toes lose feeling (numb), tingle, or turn cold or blue.  If you have an air splint:  Add or let out air to make it comfortable.  Take it off at night and to shower and bathe.  Wiggle your toes and move your ankle up and down often while you are wearing it. GET HELP IF:  You have rapidly increasing bruising or puffiness (swelling).  Your toes feel very cold.  You lose feeling in your foot.  Your medicine does not help your pain. GET HELP RIGHT AWAY IF:   Your toes lose feeling (numb) or turn blue.  You have severe pain that is increasing. MAKE SURE YOU:   Understand these instructions.  Will watch your condition.  Will get help right away if you are not doing well or get worse. Document Released: 03/08/2008 Document Revised: 02/04/2014 Document Reviewed: 04/03/2012 [employer] Patient Information 2015 [place], [place]. This information is not intended to replace advice given to you by your health care provider. Make sure you discuss any questions you have with your health care  provider.  Back Pain, Adult Back pain is very common. The pain often gets better over time. The cause of back pain is usually not dangerous. Most people can learn to manage their back pain on their own.  HOME CARE   Stay active. Start with short walks on flat ground if you can. Try to walk farther each day.  Do not sit, drive, or stand in one place for more than 30 minutes. Do not stay in bed.  Do not avoid exercise or work. Activity can help your back heal faster.  Be careful when you bend or lift an object. Bend at your knees, keep the object close to you, and do not twist.  Sleep on a firm mattress. Lie on your side, and bend your knees. If you lie on your back, put a pillow under your knees.  Only take medicines as told by your doctor.  Put ice on the injured area.  Put ice in a plastic bag.  Place a towel between your skin and the bag.  Leave the ice on for 15-20 minutes, 03-04 times a day for the first 2 to 3 days. After that, you can switch between ice and heat packs.  Ask your doctor about back exercises or massage.  Avoid feeling anxious or stressed. Find good ways to deal with  stress, such as exercise. GET HELP RIGHT AWAY IF:   Your pain does not go away with rest or medicine.  Your pain does not go away in 1 week.  You have new problems.  You do not feel well.  The pain spreads into your legs.  You cannot control when you poop (bowel movement) or pee (urinate).  Your arms or legs feel weak or lose feeling (numbness).  You feel sick to your stomach (nauseous) or throw up (vomit).  You have belly (abdominal) pain.  You feel like you may pass out (faint). MAKE SURE YOU:   Understand these instructions.  Will watch your condition.  Will get help right away if you are not doing well or get worse. Document Released: 03/08/2008 Document Revised: 12/13/2011 Document Reviewed: 01/22/2014 [facility] Patient Information 2015 [place], [place]. This information is  not intended to replace advice given to you by your health care provider. Make sure you discuss any questions you have with your health care provider.   Wear the ASO and use the crutches or a walker if you choose to avoid weight bearing.  Use ice and elevation as much as possible for the next several days to help reduce the swelling.  Take the medications prescribed.  You may take the oxycodone prescribed for pain relief.  This will make you drowsy - do not drive within 4 hours of taking this medication.  Use the ibuprofen also for inflammation.  Call your doctor listed for a recheck of your injury if not improved by the end of this week, although as discussed,  You may have persistent but slowly improving pain up to a month out from such an ankle sprain.  You may benefit from physical therapy of your ankle if it is not getting better over the next week.  Also, I suggest talking to your primary doctor about being re-evaluated by a neurosurgeon given your persistent and worsening back pain problems.

## 2014-09-02 NOTE — ED Notes (Signed)
Pt fell today and twisted her left ankle. Ankle is swollen with bruised area. Pulses are palpable with good cap refill. Pt in NAD.

## 2015-01-16 ENCOUNTER — Emergency Department (HOSPITAL_COMMUNITY): Payer: 59

## 2015-01-16 ENCOUNTER — Encounter (HOSPITAL_COMMUNITY): Payer: Self-pay | Admitting: *Deleted

## 2015-01-16 ENCOUNTER — Emergency Department (HOSPITAL_COMMUNITY)
Admission: EM | Admit: 2015-01-16 | Discharge: 2015-01-16 | Disposition: A | Discharge status: Home / Self Care | Payer: 59 | Attending: Emergency Medicine | Admitting: Emergency Medicine

## 2015-01-16 DIAGNOSIS — Z8719 Personal history of other diseases of the digestive system: Secondary | ICD-10-CM | POA: Diagnosis not present

## 2015-01-16 DIAGNOSIS — Z79899 Other long term (current) drug therapy: Secondary | ICD-10-CM | POA: Insufficient documentation

## 2015-01-16 DIAGNOSIS — I10 Essential (primary) hypertension: Secondary | ICD-10-CM | POA: Insufficient documentation

## 2015-01-16 DIAGNOSIS — F419 Anxiety disorder, unspecified: Secondary | ICD-10-CM | POA: Diagnosis not present

## 2015-01-16 DIAGNOSIS — R002 Palpitations: Secondary | ICD-10-CM | POA: Diagnosis not present

## 2015-01-16 DIAGNOSIS — M1990 Unspecified osteoarthritis, unspecified site: Secondary | ICD-10-CM | POA: Diagnosis not present

## 2015-01-16 DIAGNOSIS — R000 Tachycardia, unspecified: Secondary | ICD-10-CM | POA: Diagnosis not present

## 2015-01-16 LAB — COMPREHENSIVE METABOLIC PANEL
ALT: 45 U/L — AB (ref 0–35)
AST: 48 U/L — ABNORMAL HIGH (ref 0–37)
Albumin: 4.4 g/dL (ref 3.5–5.2)
Alkaline Phosphatase: 63 U/L (ref 39–117)
Anion gap: 9 (ref 5–15)
BUN: 11 mg/dL (ref 6–23)
CALCIUM: 9.8 mg/dL (ref 8.4–10.5)
CO2: 26 mmol/L (ref 19–32)
CREATININE: 0.62 mg/dL (ref 0.50–1.10)
Chloride: 105 mmol/L (ref 96–112)
GFR calc Af Amer: >90 mL/min (ref >90)
GFR calc non Af Amer: >90 mL/min (ref >90)
GLUCOSE: 108 mg/dL — AB (ref 70–99)
Potassium: 5.4 mmol/L — ABNORMAL HIGH (ref 3.5–5.1)
SODIUM: 140 mmol/L (ref 135–145)
TOTAL PROTEIN: 7.8 g/dL (ref 6.0–8.3)
Total Bilirubin: 0.6 mg/dL (ref 0.3–1.2)

## 2015-01-16 LAB — CBC WITH DIFFERENTIAL/PLATELET
BASOS ABS: 0 10*3/uL (ref 0.0–0.1)
Basophils Relative: 0 % (ref 0–1)
EOS ABS: 0.1 10*3/uL (ref 0.0–0.7)
Eosinophils Relative: 1 % (ref 0–5)
HCT: 38.1 % (ref 36.0–46.0)
Hemoglobin: 12.7 g/dL (ref 12.0–15.0)
LYMPHS ABS: 2 10*3/uL (ref 0.7–4.0)
Lymphocytes Relative: 25 % (ref 12–46)
MCH: 30.9 pg (ref 26.0–34.0)
MCHC: 33.3 g/dL (ref 30.0–36.0)
MCV: 92.7 fL (ref 78.0–100.0)
Monocytes Absolute: 0.5 10*3/uL (ref 0.1–1.0)
Monocytes Relative: 6 % (ref 3–12)
NEUTROS PCT: 68 % (ref 43–77)
Neutro Abs: 5.4 10*3/uL (ref 1.7–7.7)
PLATELETS: 234 10*3/uL (ref 150–400)
RBC: 4.11 MIL/uL (ref 3.87–5.11)
RDW: 12.7 % (ref 11.5–15.5)
WBC: 8 10*3/uL (ref 4.0–10.5)

## 2015-01-16 LAB — TROPONIN I: Troponin I: <0.03 ng/mL

## 2015-01-16 MED ORDER — LORAZEPAM 2 MG/ML IJ SOLN
0.5000 mg | Freq: Once | INTRAMUSCULAR | Status: AC
  Administered 2015-01-16: 0.5 mg via INTRAVENOUS
  Filled 2015-01-16: qty 1

## 2015-01-16 MED ORDER — HYDROMORPHONE HCL 1 MG/ML IJ SOLN
0.5000 mg | Freq: Once | INTRAMUSCULAR | Status: AC
  Administered 2015-01-16: 0.5 mg via INTRAVENOUS
  Filled 2015-01-16: qty 1

## 2015-01-16 MED ORDER — IBUPROFEN 800 MG PO TABS
ORAL_TABLET | ORAL | Status: AC
  Filled 2015-01-16: qty 1

## 2015-01-16 MED ORDER — IBUPROFEN 800 MG PO TABS
800.0000 mg | ORAL_TABLET | Freq: Once | ORAL | Status: AC
  Administered 2015-01-16: 800 mg via ORAL

## 2015-01-16 MED ORDER — KETOROLAC TROMETHAMINE 30 MG/ML IJ SOLN
30.0000 mg | Freq: Once | INTRAMUSCULAR | Status: AC
  Administered 2015-01-16: 30 mg via INTRAVENOUS
  Filled 2015-01-16: qty 1

## 2015-01-16 NOTE — ED Notes (Addendum)
Pt ambulatory to the bathroom with assistance.

## 2015-01-16 NOTE — ED Notes (Signed)
Patient given discharge instruction, verbalized understand. IV removed, band aid applied. Patient wheelchair out of the department.  

## 2015-01-16 NOTE — Discharge Instructions (Signed)
Increase toprol to two pills a day.  Follow up with your md for recheck and recheck of potassium

## 2015-01-16 NOTE — ED Notes (Signed)
Per EMS, Pt recently began taking metoprolol again. Pt began to feel fluttering to her heart when she got to work. Pt is crying and anxious. States it is difficult for her to form her words.

## 2015-01-16 NOTE — ED Notes (Signed)
Patient requesting to use the bathroom. States "I can't use the bedpan. My husband can help me to the bathroom." Patient helped to wheelchair by spouse. Transported to bathroom by this nurse and this nurse stayed at patient's side during elimination. Patient transported herself to toilet with no difficulty or assistance. Patient returned to bed in room via wheelchair.

## 2015-01-16 NOTE — ED Provider Notes (Signed)
CSN: [id number]     Arrival date & time 01/16/15  5621 History   First MD Initiated Contact with Patient 01/16/15 1002     Chief Complaint  Patient presents with  . Hypertension  . Palpitations     (Consider location/radiation/quality/duration/timing/severity/associated sxs/prior Treatment) Patient is a 34 y.o. female presenting with hypertension and palpitations. The history is provided by the patient (the pt states she felt like she could not think good and she felt her her jumping).  Hypertension This is a recurrent problem. The current episode started 1 to 2 hours ago. The problem occurs constantly. The problem has not changed since onset.Pertinent negatives include no chest pain, no abdominal pain and no headaches. Nothing aggravates the symptoms. Nothing relieves the symptoms.  Palpitations Associated symptoms: no back pain, no chest pain and no cough     Past Medical History  Diagnosis Date  . Anxiety   . GERD (gastroesophageal reflux disease)   . Arthritis     back  . Bulging lumbar disc 01/2013  . IBS (irritable bowel syndrome)    Past Surgical History  Procedure Laterality Date  . Breast surgery    . Cosmetic surgery    . Bunionectomy Right   . Tummy tuck  2010  . Tonsillectomy      as child  . Colonoscopy N/A 11/22/2013    Procedure: COLONOSCOPY;  Surgeon: Rogene Houston, MD;  Location: [facility];  Service: Endoscopy;  Laterality: N/A;  1030   Family History  Problem Relation Age of Onset  . Heart disease Father   . Diabetes type II Father   . Transient ischemic attack Mother   . Colon polyps Son    History  Substance Use Topics  . Smoking status: Never Smoker   . Smokeless tobacco: Not on file  . Alcohol Use: Yes     Comment: social   OB History    No data available     Review of Systems  Constitutional: Negative for appetite change and fatigue.  HENT: Negative for congestion, ear discharge and sinus pressure.   Eyes: Negative for discharge.   Respiratory: Negative for cough.   Cardiovascular: Positive for palpitations. Negative for chest pain.  Gastrointestinal: Negative for abdominal pain and diarrhea.  Genitourinary: Negative for frequency and hematuria.  Musculoskeletal: Negative for back pain.  Skin: Negative for rash.  Neurological: Negative for seizures and headaches.  Psychiatric/Behavioral: Negative for hallucinations.      Allergies  Scopolamine  Home Medications   Prior to Admission medications   Medication Sig Start Date End Date Taking? Authorizing Provider  ALPRAZolam Duanne Moron) 1 MG tablet Take 1 mg by mouth 3 (three) times daily as needed for anxiety.    Yes Historical Provider, MD  dicyclomine (BENTYL) 10 MG capsule Take 1 capsule (10 mg total) by mouth 3 (three) times daily before meals. 11/22/13  Yes Rogene Houston, MD  DULoxetine (CYMBALTA) 30 MG capsule Take 90 mg by mouth every morning.   Yes Historical Provider, MD  metoprolol succinate (TOPROL-XL) 25 MG 24 hr tablet Take 25 mg by mouth daily.   Yes Historical Provider, MD  Multiple Vitamin (MULTIVITAMIN WITH MINERALS) TABS tablet Take 1 tablet by mouth daily.   Yes Historical Provider, MD  ondansetron (ZOFRAN ODT) 4 MG disintegrating tablet Take 1 tablet (4 mg total) by mouth every 8 (eight) hours as needed for nausea. 07/23/14  Yes [name], MD  oxyCODONE-acetaminophen (PERCOCET) 10-325 MG per tablet Take 1 tablet by  mouth every 4 (four) hours as needed for pain. 09/02/14  Yes Evalee Jefferson, PA-C  HYDROcodone-acetaminophen (NORCO/VICODIN) 5-325 MG per tablet Take 2 tablets by mouth every 4 (four) hours as needed. Patient not taking: Reported on 01/16/2015 07/23/14   [name], MD  hyoscyamine (LEVSIN/SL) 0.125 MG SL tablet Place 1 tablet (0.125 mg total) under the tongue every 6 (six) hours as needed for cramping. Patient not taking: Reported on 01/16/2015 07/23/14   [name], MD  ibuprofen (ADVIL,MOTRIN) 600 MG tablet Take 1 tablet (600 mg total) by  mouth every 6 (six) hours as needed. Patient not taking: Reported on 01/16/2015 09/02/14   Evalee Jefferson, PA-C   BP 127/73 mmHg  Pulse 90  Resp 22  Ht 5\' 6"  (1.676 m)  Wt 235 lb (106.595 kg)  BMI 37.95 kg/m2  SpO2 94%  LMP 01/02/2015 Physical Exam  Constitutional: She is oriented to person, place, and time. She appears well-developed.  HENT:  Head: Normocephalic.  Eyes: Conjunctivae and EOM are normal. No scleral icterus.  Neck: Neck supple. No thyromegaly present.  Cardiovascular: Regular rhythm.  Exam reveals no gallop and no friction rub.   No murmur heard. tachycardia  Pulmonary/Chest: No stridor. She has no wheezes. She has no rales. She exhibits no tenderness.  Abdominal: She exhibits no distension. There is no tenderness. There is no rebound.  Musculoskeletal: Normal range of motion. She exhibits no edema.  Lymphadenopathy:    She has no cervical adenopathy.  Neurological: She is oriented to person, place, and time. She exhibits normal muscle tone. Coordination normal.  Skin: No rash noted. No erythema.  Psychiatric:  Pt very anxious    ED Course  Procedures (including critical care time) Labs Review Labs Reviewed  COMPREHENSIVE METABOLIC PANEL - Abnormal; Notable for the following:    Potassium 5.4 (*)    Glucose, Bld 108 (*)    AST 48 (*)    ALT 45 (*)    All other components within normal limits  CBC WITH DIFFERENTIAL/PLATELET  TROPONIN I    Imaging Review Ct Head Wo Contrast  01/16/2015   CLINICAL DATA:  Altered mental status.  Difficulty speaking.  EXAM: CT HEAD WITHOUT CONTRAST  TECHNIQUE: Contiguous axial images were obtained from the base of the skull through the vertex without intravenous contrast.  COMPARISON:  None.  FINDINGS: The brain appears normal without hemorrhage, infarct, mass lesion, mass effect, midline shift or abnormal extra-axial fluid collection. No hydrocephalus or pneumocephalus. The calvarium is intact. Imaged paranasal sinuses and mastoid  air cells are clear.  IMPRESSION: Negative head CT.   Electronically Signed   By: Inge Rise M.D.   On: 01/16/2015 11:05   Dg Chest Portable 1 View  01/16/2015   CLINICAL DATA:  Palpitations.  Hypertension.  EXAM: PORTABLE CHEST - 1 VIEW  COMPARISON:  Chest x-ray dated 08/22/2011  FINDINGS: The heart size and mediastinal contours are within normal limits. Both lungs are clear. The visualized skeletal structures are unremarkable.  IMPRESSION: Normal exam.   Electronically Signed   By: Lorriane Shire M.D.   On: 01/16/2015 10:40     EKG Interpretation   Date/Time:  Thursday January 16 2015 10:00:03 EDT Ventricular Rate:  112 PR Interval:  121 QRS Duration: 83 QT Interval:  330 QTC Calculation: 450 R Axis:   53 Text Interpretation:  Sinus tachycardia Confirmed by Brinley Treanor  MD, Myer Bohlman  [phone]) on 01/16/2015 2:41:08 PM      MDM   Final diagnoses:  Essential  hypertension  Anxiety    Pt will increase her betablocker and follow up with her md  Milton Ferguson, MD 01/16/15 1442

## 2015-01-16 NOTE — ED Notes (Signed)
Patient requesting pain medication for "headache". Advised Dr Roderic Palau. Verbal order for ibuprofen 800 mg PO.

## 2015-05-23 ENCOUNTER — Other Ambulatory Visit (HOSPITAL_COMMUNITY): Payer: Self-pay | Admitting: Family Medicine

## 2015-05-23 DIAGNOSIS — M5136 Other intervertebral disc degeneration, lumbar region: Secondary | ICD-10-CM

## 2015-05-23 DIAGNOSIS — M5416 Radiculopathy, lumbar region: Secondary | ICD-10-CM

## 2015-06-04 ENCOUNTER — Ambulatory Visit (HOSPITAL_COMMUNITY)
Admission: RE | Admit: 2015-06-04 | Discharge: 2015-06-04 | Disposition: A | Discharge status: Home / Self Care | Payer: 59 | Source: Ambulatory Visit | Attending: Family Medicine | Admitting: Family Medicine

## 2015-06-04 DIAGNOSIS — M5127 Other intervertebral disc displacement, lumbosacral region: Secondary | ICD-10-CM | POA: Insufficient documentation

## 2015-06-04 DIAGNOSIS — M5416 Radiculopathy, lumbar region: Secondary | ICD-10-CM

## 2015-06-04 DIAGNOSIS — R200 Anesthesia of skin: Secondary | ICD-10-CM | POA: Diagnosis not present

## 2015-06-04 DIAGNOSIS — M5136 Other intervertebral disc degeneration, lumbar region: Secondary | ICD-10-CM | POA: Diagnosis not present

## 2015-06-04 DIAGNOSIS — M545 Low back pain: Secondary | ICD-10-CM | POA: Diagnosis present

## 2015-09-27 ENCOUNTER — Encounter (HOSPITAL_COMMUNITY): Payer: Self-pay | Admitting: *Deleted

## 2015-09-27 ENCOUNTER — Emergency Department (HOSPITAL_COMMUNITY)
Admission: EM | Admit: 2015-09-27 | Discharge: 2015-09-27 | Disposition: A | Discharge status: Home / Self Care | Payer: 59 | Source: Home / Self Care | Attending: Emergency Medicine | Admitting: Emergency Medicine

## 2015-09-27 DIAGNOSIS — J111 Influenza due to unidentified influenza virus with other respiratory manifestations: Secondary | ICD-10-CM | POA: Diagnosis not present

## 2015-09-27 MED ORDER — METOPROLOL SUCCINATE ER 25 MG PO TB24: 25.0000 mg | ORAL_TABLET | Freq: Every day | ORAL | Status: DC

## 2015-09-27 MED ORDER — TRAMADOL HCL 50 MG PO TABS: 50.0000 mg | ORAL_TABLET | Freq: Four times a day (QID) | ORAL | Status: DC | PRN

## 2015-09-27 MED ORDER — OSELTAMIVIR PHOSPHATE 75 MG PO CAPS: 75.0000 mg | ORAL_CAPSULE | Freq: Two times a day (BID) | ORAL | Status: DC

## 2015-09-27 NOTE — ED Notes (Signed)
Pt  Reports   Symptoms  Of  Cough   /  Congestion   And  Malaise    With  Onset      X  2  Days         Pt  REPORTS  IT   FELT  LIKE  SHE  HAD  FEVER  EARLIER        sYMPTOMS  NOT  RELEIVED  BY OTC  MEDS

## 2015-09-27 NOTE — Discharge Instructions (Signed)
Influenza, Adult Influenza (flu) is an infection in the mouth, nose, and throat (respiratory tract) caused by a virus. The flu can make you feel very ill. Influenza spreads easily from person to person (contagious).  HOME CARE   Only take medicines as told by your doctor: Tamiflu twice a day for 5 days.  Tramadol as needed for pain.  Use a cool mist humidifier to make breathing easier.  Get plenty of rest until your fever goes away. This usually takes 3 to 4 days.  Drink enough fluids to keep your pee (urine) clear or pale yellow.  Cover your mouth and nose when you cough or sneeze.  Wash your hands well to avoid spreading the flu.  Stay home from work or school until your fever has been gone for at least 1 full day.  Get a flu shot every year. GET HELP RIGHT AWAY IF:   You have trouble breathing or feel short of breath.  Your skin or nails turn blue.  You have severe neck pain or stiffness.  You have a severe headache, facial pain, or earache.  Your fever gets worse or keeps coming back.  You feel sick to your stomach (nauseous), throw up (vomit), or have watery poop (diarrhea).  You have chest pain.  You have a deep cough that gets worse, or you cough up more thick spit (mucus). MAKE SURE YOU:   Understand these instructions.  Will watch your condition.  Will get help right away if you are not doing well or get worse.   This information is not intended to replace advice given to you by your health care provider. Make sure you discuss any questions you have with your health care provider.   Document Released: 06/29/2008 Document Revised: 10/11/2014 Document Reviewed: 12/20/2011 Elsevier Interactive Patient Education [employer].

## 2015-09-27 NOTE — ED Provider Notes (Signed)
CSN: [id number]     Arrival date & time 09/27/15  1555 History   First MD Initiated Contact with Patient 09/27/15 1620     Chief Complaint  Patient presents with  . URI   (Consider location/radiation/quality/duration/timing/severity/associated sxs/prior Treatment) HPI  She is a 34 year old woman here for evaluation of fever. She reports her symptoms started 2 days ago with fever, nasal congestion, sore throat, cough, and body aches. She reports fairly intense body aches. Fever was up to 101 yesterday. She has been taking NyQuil with some improvement of the fever. Cough is nonproductive. She reports some intermittent nausea, but no vomiting. Appetite is decreased, but she is able to tolerate food and liquids.  She reports feeling fatigued and without energy.  Past Medical History  Diagnosis Date  . Anxiety   . GERD (gastroesophageal reflux disease)   . Arthritis     back  . Bulging lumbar disc 01/2013  . IBS (irritable bowel syndrome)    Past Surgical History  Procedure Laterality Date  . Breast surgery    . Cosmetic surgery    . Bunionectomy Right   . Tummy tuck  2010  . Tonsillectomy      as child  . Colonoscopy N/A 11/22/2013    Procedure: COLONOSCOPY;  Surgeon: Rogene Houston, MD;  Location: [facility];  Service: Endoscopy;  Laterality: N/A;  1030   Family History  Problem Relation Age of Onset  . Heart disease Father   . Diabetes type II Father   . Transient ischemic attack Mother   . Colon polyps Son    Social History  Substance Use Topics  . Smoking status: Never Smoker   . Smokeless tobacco: None  . Alcohol Use: Yes     Comment: social   OB History    No data available     Review of Systems As in history of present illness Allergies  Scopolamine  Home Medications   Prior to Admission medications   Medication Sig Start Date End Date Taking? Authorizing Provider  ALPRAZolam Duanne Moron) 1 MG tablet Take 1 mg by mouth 3 (three) times daily as needed for  anxiety.     Historical Provider, MD  dicyclomine (BENTYL) 10 MG capsule Take 1 capsule (10 mg total) by mouth 3 (three) times daily before meals. 11/22/13   Rogene Houston, MD  DULoxetine (CYMBALTA) 30 MG capsule Take 90 mg by mouth every morning.    Historical Provider, MD  metoprolol succinate (TOPROL-XL) 25 MG 24 hr tablet Take 25 mg by mouth daily.    Historical Provider, MD  Multiple Vitamin (MULTIVITAMIN WITH MINERALS) TABS tablet Take 1 tablet by mouth daily.    Historical Provider, MD  ondansetron (ZOFRAN ODT) 4 MG disintegrating tablet Take 1 tablet (4 mg total) by mouth every 8 (eight) hours as needed for nausea. 07/23/14   [name], MD  oseltamivir (TAMIFLU) 75 MG capsule Take 1 capsule (75 mg total) by mouth every 12 (twelve) hours. 09/27/15   Melony Overly, MD  oxyCODONE-acetaminophen (PERCOCET) 10-325 MG per tablet Take 1 tablet by mouth every 4 (four) hours as needed for pain. 09/02/14   Evalee Jefferson, PA-C  traMADol (ULTRAM) 50 MG tablet Take 1 tablet (50 mg total) by mouth every 6 (six) hours as needed. 09/27/15   Melony Overly, MD   Meds Ordered and Administered this Visit  Medications - No data to display  BP 139/77 mmHg  Pulse 133  Temp(Src) 98 F (36.7 C) (  Oral)  SpO2 100%  LMP 09/20/2015 No data found.   Physical Exam  Constitutional: She is oriented to person, place, and time. She appears well-developed and well-nourished. No distress.  Appears somewhat ill  HENT:  Mouth/Throat: No oropharyngeal exudate.  Mild oropharyngeal erythema. Nasal mucosa is normal. TMs normal bilaterally.  Neck: Neck supple.  Cardiovascular: Regular rhythm and normal heart sounds.   No murmur heard. Tachycardic  Pulmonary/Chest: Effort normal and breath sounds normal. No respiratory distress. She has no wheezes. She has no rales.  Lymphadenopathy:    She has no cervical adenopathy.  Neurological: She is alert and oriented to person, place, and time.    ED Course  Procedures  (including critical care time)  Labs Review Labs Reviewed - No data to display  Imaging Review No results found.    MDM   1. Influenza    Symptoms are consistent with flu. She is borderline for treatment with Tamiflu, but she does request a prescription. Discussed Tylenol and ibuprofen as needed for fever and body aches. Prescription given for tramadol to use as needed for severe body aches. Discussed rest and fluids. She is tachycardic here. This is likely due to being acutely ill with the flu. Her vital signs are otherwise stable. No sign of pneumonia on lung exam and she is satting 100% on room air.     Melony Overly, MD 09/27/15 [phone]

## 2015-10-07 ENCOUNTER — Ambulatory Visit (INDEPENDENT_AMBULATORY_CARE_PROVIDER_SITE_OTHER): Payer: 59 | Admitting: Family Medicine

## 2015-10-07 VITALS — BP 132/86 | HR 98 | Temp 98.1°F | Resp 16 | Ht 69.0 in | Wt 269.0 lb

## 2015-10-07 DIAGNOSIS — R05 Cough: Secondary | ICD-10-CM | POA: Diagnosis not present

## 2015-10-07 DIAGNOSIS — E039 Hypothyroidism, unspecified: Secondary | ICD-10-CM | POA: Diagnosis not present

## 2015-10-07 DIAGNOSIS — J209 Acute bronchitis, unspecified: Secondary | ICD-10-CM

## 2015-10-07 DIAGNOSIS — R059 Cough, unspecified: Secondary | ICD-10-CM

## 2015-10-07 MED ORDER — PREDNISONE 20 MG PO TABS: ORAL_TABLET | ORAL | Status: DC

## 2015-10-07 MED ORDER — CEFDINIR 300 MG PO CAPS: 300.0000 mg | ORAL_CAPSULE | Freq: Two times a day (BID) | ORAL | Status: DC

## 2015-10-07 MED ORDER — HYDROCOD POLST-CPM POLST ER 10-8 MG/5ML PO SUER: 5.0000 mL | Freq: Two times a day (BID) | ORAL | Status: DC | PRN

## 2015-10-07 NOTE — Progress Notes (Signed)
[facility] [address] [place] [address] [phone]  Date:  10/07/2015   Name:  Amy [place]   DOB:  [birth year]   MRN:  [id number]  PCP:  Celedonio Savage, MD    Chief Complaint: Cough; Headache; and Nasal Congestion   History of Present Illness:  Amy [place] is a 35 y.o. very pleasant female patient who presents with the following:  Here today with illness.  She was seen in the ER on 12/24 with flu like sx.  However no test done.  She was treated with tamiflu. No labs at that time   She states that she has "just this lingering mess."  She has a persistent severe cough, and a left earache.   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    She does not have a fever The cough is productive of some mucus only.    LMP 12/17 She feels tired and achy.  The cough is wearing her down.    She has noted some diarrhea but she does have IBS so this is not new  She was recently dx with hypothyroidism.  She is on synthroid and is doing much better.  However she is not seeing an endocrinologist and is not sure if all is taken care of.  She gained about 80 lbs over the last year due to her hypothyroidism. It seems that she was started on 50 mcg but her level has not been rechecked since.  She also was noted to have a thyroid nodule of some sort and had a bx that was bening  She does take xanax on occasion- takes it maybe once a day  There are no active problems to display for this patient.   Past Medical History  Diagnosis Date  . Anxiety   . GERD (gastroesophageal reflux disease)   . Arthritis     back  . Bulging lumbar disc 01/2013  . IBS (irritable bowel syndrome)     Past Surgical History  Procedure Laterality Date  .  Breast surgery    . Cosmetic surgery    . Bunionectomy Right   . Tummy tuck  2010  . Tonsillectomy      as child  . Colonoscopy N/A 11/22/2013    Procedure: COLONOSCOPY;  Surgeon: Rogene Houston, MD;  Location: [facility];  Service: Endoscopy;  Laterality: N/A;  1030    Social History  Substance Use Topics  . Smoking status: Never Smoker   . Smokeless tobacco: None  . Alcohol Use: Yes     Comment: social    Family History  Problem Relation Age of Onset  . Heart disease Father   . Diabetes type II Father   . Transient ischemic attack Mother   . Colon polyps Son     Allergies  Allergen Reactions  . Scopolamine Nausea And Vomiting    TRAMSDERM    Medication list has been reviewed and updated.  Current Outpatient Prescriptions on File Prior to Visit  Medication Sig Dispense Refill  . ALPRAZolam (XANAX) 1 MG tablet Take 1 mg by mouth 3 (three) times daily as needed for anxiety.     Marland Kitchen  dicyclomine (BENTYL) 10 MG capsule Take 1 capsule (10 mg total) by mouth 3 (three) times daily before meals. 90 capsule 5  . DULoxetine (CYMBALTA) 30 MG capsule Take 90 mg by mouth every morning.    . metoprolol succinate (TOPROL-XL) 25 MG 24 hr tablet Take 1 tablet (25 mg total) by mouth daily. 10 tablet 0  . Multiple Vitamin (MULTIVITAMIN WITH MINERALS) TABS tablet Take 1 tablet by mouth daily.    . ondansetron (ZOFRAN ODT) 4 MG disintegrating tablet Take 1 tablet (4 mg total) by mouth every 8 (eight) hours as needed for nausea. (Patient not taking: Reported on 10/07/2015) 20 tablet 0  . oseltamivir (TAMIFLU) 75 MG capsule Take 1 capsule (75 mg total) by mouth every 12 (twelve) hours. (Patient not taking: Reported on 10/07/2015) 10 capsule 0  . oxyCODONE-acetaminophen (PERCOCET) 10-325 MG per tablet Take 1 tablet by mouth every 4 (four) hours as needed for pain. (Patient not taking: Reported on 10/07/2015) 20 tablet 0  . traMADol (ULTRAM) 50 MG tablet Take 1 tablet (50 mg total) by mouth every 6  (six) hours as needed. (Patient not taking: Reported on 10/07/2015) 15 tablet 0  . [DISCONTINUED] hyoscyamine (LEVSIN/SL) 0.125 MG SL tablet Place 1 tablet (0.125 mg total) under the tongue every 6 (six) hours as needed for cramping. (Patient not taking: Reported on 01/16/2015) 30 tablet 0   No current facility-administered medications on file prior to visit.    Review of Systems:  As per HPI- otherwise negative.   Physical Examination: Filed Vitals:   10/07/15 1354  BP: 132/86  Pulse: 98  Temp: 98.1 F (36.7 C)  Resp: 16   Filed Vitals:   10/07/15 1354  Height: 5\' 9"  (1.753 m)  Weight: 269 lb (122.018 kg)   Body mass index is 39.71 kg/(m^2). Ideal Body Weight: Weight in (lb) to have BMI = 25: 168.9  GEN: WDWN, NAD, Non-toxic, A & O x 3, obese, looks well but is coughing  HEENT: Atraumatic, Normocephalic. Neck supple. No masses, No LAD.  Bilateral TM wnl, oropharynx normal.  PEERL,EOMI.   Ears and Nose: No external deformity. CV: RRR, No M/G/R. No JVD. No thrill. No extra heart sounds. PULM: CTA B, no wheezes, crackles, rhonchi. No retractions. No resp. distress. No accessory muscle use. ABD: S, NT, ND, +BS. No rebound. No HSM. EXTR: No c/c/e NEURO Normal gait.  PSYCH: Normally interactive. Conversant. Not depressed or anxious appearing.  Calm demeanor.    Assessment and Plan: Acute bronchitis, unspecified organism - Plan: cefdinir (OMNICEF) 300 MG capsule, predniSONE (DELTASONE) 20 MG tablet, chlorpheniramine-HYDROcodone (TUSSIONEX PENNKINETIC ER) 10-8 MG/5ML SUER  Cough - Plan: chlorpheniramine-HYDROcodone (TUSSIONEX PENNKINETIC ER) 10-8 MG/5ML SUER  Hypothyroidism, unspecified hypothyroidism type - Plan: Ambulatory referral to Endocrinology  Will treat as above.  Cautioned not to combine tussionex with xanax as this could cause excessive sedation Will refer to endocrinology as she is concerned about her thyroid problem. Reassured that she should have a much easier time  losing weight now that her thyroid is being treated  Signed Lamar Blinks, MD

## 2015-10-07 NOTE — Patient Instructions (Signed)
We are going to treat you with  omnicef- antibiotic for bronchitis Prednisone- steroid to help with cough and lung irritation tussionex- cough syrup.   tussionex will make you sleepy!  Do not use it when you need to drive and avoid combining with alcohol or xanax Please let me know if you are not feeling better in the next few days- Sooner if worse.

## 2015-10-23 ENCOUNTER — Encounter: Payer: Self-pay | Admitting: Internal Medicine

## 2015-10-23 ENCOUNTER — Ambulatory Visit (INDEPENDENT_AMBULATORY_CARE_PROVIDER_SITE_OTHER): Payer: 59 | Admitting: Internal Medicine

## 2015-10-23 VITALS — BP 114/64 | HR 113 | Temp 97.3°F | Resp 12 | Ht 70.0 in | Wt 276.0 lb

## 2015-10-23 DIAGNOSIS — E039 Hypothyroidism, unspecified: Secondary | ICD-10-CM

## 2015-10-23 DIAGNOSIS — E041 Nontoxic single thyroid nodule: Secondary | ICD-10-CM

## 2015-10-23 LAB — T4, FREE: FREE T4: 0.65 ng/dL (ref 0.60–1.60)

## 2015-10-23 LAB — TSH: TSH: 1.29 u[IU]/mL (ref 0.35–4.50)

## 2015-10-23 LAB — T3, FREE: T3 FREE: 4 pg/mL (ref 2.3–4.2)

## 2015-10-23 NOTE — Progress Notes (Signed)
Patient ID: Amy [place], female   DOB: 05/26/1981, 35 y.o.   MRN: [id number]   HPI  Amy [place] is a 35 y.o.-year-old female, referred by Dr. Lorelei Pont, for management of hypothyroidism and a thyroid nodule.  Pt. has been dx with hypothyroidism in 08/2015 when she presented to the PCP after feeling a thyroid nodule; is on Levothyroxine 50 >> 75 mcg (dose increase in the last 3 days), taken: - fasting - with water - separated by 15-30 min from b'fast  - no calcium, iron, PPIs - + multivitamins occasionally in am  Unfortunately, I did not have any thyroid tests to review at this visit.  Pt describes: - + depression  - started on Cymbalta by psychiatry - + weight gain - 80 lbs in last year - fatigue - + irregular menses with increased clotting  - + Heat intolerance - + Diarrhea (IBS) - no hair loss  She has a left thyroid cyst, with a benign biopsy in 09/2015.  Pt denies feeling nodules in neck, hoarseness, dysphagia/odynophagia, SOB with lying down. She did however feel her left thyroid cyst in 08/2015.  She has no FH of thyroid disorders: MGM. No FH of thyroid cancer.  No h/o radiation tx to head or neck. No recent use of iodine supplements.  I reviewed her chart and she also has a history of low back pain  - on steroid injections (by pain management) - 1 inj 2012 and the second 08/2015. She lost consciousness and had HTN ([id number] systolic) in [id number] >> started on BP meds. She also has hyperlipidemia.   ROS: Constitutional: Please see history of present illness Eyes: no blurry vision, no xerophthalmia ENT: no sore throat, no nodules palpated in throat, no dysphagia/odynophagia, no hoarseness Cardiovascular: no CP/SOB/palpitations/+ hand and feet swelling Respiratory: no cough/SOB Gastrointestinal: no N/V/D/C/+ acid reflux Musculoskeletal: no muscle/joint aches Skin: no rashes Neurological: no tremors/numbness/tingling/dizziness Psychiatric: + depression/+  anxiety  Past Medical History  Diagnosis Date  . Anxiety   . GERD (gastroesophageal reflux disease)   . Arthritis     back  . Bulging lumbar disc 01/2013  . IBS (irritable bowel syndrome)    Past Surgical History  Procedure Laterality Date  . Breast surgery    . Cosmetic surgery    . Bunionectomy Right   . Tummy tuck  2010  . Tonsillectomy      as child  . Colonoscopy N/A 11/22/2013    Procedure: COLONOSCOPY;  Surgeon: Rogene Houston, MD;  Location: [facility];  Service: Endoscopy;  Laterality: N/A;  1030  + breast reduction in 2008, breast implants in 2011  Social History   Social History  . Marital Status: Married    Spouse Name: N/A  . Number of Children: 2   Occupational History  .  teacher    Social History Main Topics  . Smoking status: Never Smoker   . Smokeless tobacco: Not on file  . Alcohol Use: Yes     Comment: social  . Drug Use: No  . Sexual Activity: Yes   Current Outpatient Prescriptions on File Prior to Visit  Medication Sig Dispense Refill  . ALPRAZolam (XANAX) 1 MG tablet Take 1 mg by mouth 3 (three) times daily as needed for anxiety.     . dicyclomine (BENTYL) 10 MG capsule Take 1 capsule (10 mg total) by mouth 3 (three) times daily before meals. 90 capsule 5  . DULoxetine (CYMBALTA) 30 MG capsule Take 90 mg by mouth  every morning.    Marland Kitchen levothyroxine (SYNTHROID, LEVOTHROID) 50 MCG tablet Take 50 mcg by mouth daily before breakfast.    . metoprolol succinate (TOPROL-XL) 25 MG 24 hr tablet Take 1 tablet (25 mg total) by mouth daily. 10 tablet 0  . Multiple Vitamin (MULTIVITAMIN WITH MINERALS) TABS tablet Take 1 tablet by mouth daily.    . [DISCONTINUED] hyoscyamine (LEVSIN/SL) 0.125 MG SL tablet Place 1 tablet (0.125 mg total) under the tongue every 6 (six) hours as needed for cramping. (Patient not taking: Reported on 01/16/2015) 30 tablet 0   No current facility-administered medications on file prior to visit.   Allergies  Allergen Reactions   . Scopolamine Nausea And Vomiting    TRAMSDERM   Family History  Problem Relation Age of Onset  . Heart disease Father   . Diabetes type II Father   . Transient ischemic attack Mother   . Colon polyps Son    PE: BP 114/64 mmHg  Pulse 113  Temp(Src) 97.3 F (36.3 C) (Oral)  Resp 12  Ht 5\' 10"  (1.778 m)  Wt 276 lb (125.193 kg)  BMI 39.60 kg/m2  SpO2 98%  LMP 09/20/2015 (Approximate) Wt Readings from Last 3 Encounters:  10/23/15 276 lb (125.193 kg)  10/07/15 269 lb (122.018 kg)  06/04/15 230 lb (104.327 kg)   Constitutional: Obese, in NAD Eyes: PERRLA, EOMI, no exophthalmos ENT: moist mucous membranes, no thyromegaly, no cervical lymphadenopathy Cardiovascular: RRR, No MRG Respiratory: CTA B Gastrointestinal: abdomen soft, NT, ND, BS+ Musculoskeletal: no deformities, strength intact in all 4 Skin: moist, warm, no rashes Neurological: no tremor with outstretched hands, DTR normal in all 4  ASSESSMENT: 1. Hypothyroidism  2. L thyroid cyst  PLAN:  1. Patient with a new diagnosis of hypothyroidism, on levothyroxine therapy. I do not have previous thyroid levels to review. She continues to have many hypothyroid symptoms: Weight gain, fatigue, depression, etc despite starting levothyroxine. She just increased her levothyroxine dose from 50 to 75 g 3 days ago. She does not appear to have a goiter, thyroid nodules, or neck compression symptoms - We discussed about correct intake of levothyroxine, fasting, with water, separated by at least 30 minutes from breakfast, and separated by more than 4 hours from calcium, iron, multivitamins, acid reflux medications (PPIs). - will check thyroid tests today: TSH, free T4, TPO antibodies - we did discuss about the fact that she may have Hashimoto thyroiditis, which is an autoimmune disease that can lead to total destruction of her thyroid gland and reliance on levothyroxine. I explained that there is no specific treatment for Hashimoto's  disease other than normalizing the thyroid function tests. Also, she can try to improve her immune system by avoiding steroid injections, getting plenty of rest, relaxing/meditating, starting to move a little bit every day, and watching her diet and making sure that it includes plenty of fruit and vegetables. I believe the many of her symptoms could be caused by her previous use of steroids and I strongly advised her to avoid using further injections. I advised her that this hang on in her system for a long time, usually months and can cause a multitude of problems, including weight gain, fatigue, depressed immune system, depression. - If these are abnormal, she will need to return in  5-6ks for repeat labs - If these are normal, I will see her back in 4 months  2. Left thyroid cyst  - Detected by patient  - This has been aspirated and the specimen  was benign -08/2015  - She does not feel that the cyst has re- formed - No neck compression symptoms - I do not feel any masses during neck palpation    Component     Latest Ref Rng 10/23/2015  T3, Free     2.3 - 4.2 pg/mL 4.0  Free T4     0.60 - 1.60 ng/dL 0.65  TSH     0.35 - 4.50 uIU/mL 1.29  Thyroperoxidase Ab SerPl-aCnc     <9 IU/mL 1  TFTs normal >> continue LT4 75 mcg for now and will recheck TFTs in 2 months. No signs of Hashimoto's thyroiditis.

## 2015-10-23 NOTE — Patient Instructions (Signed)
Please stop at the lab.  Please continue 75 mcg Levothyroxine daily.  Take the thyroid hormone every day, with water, at least 30 minutes before breakfast, separated by at least 4 hours from: - acid reflux medications - calcium - iron - multivitamins  Please return in 4 months.

## 2015-10-24 DIAGNOSIS — E039 Hypothyroidism, unspecified: Secondary | ICD-10-CM | POA: Insufficient documentation

## 2015-10-24 DIAGNOSIS — E041 Nontoxic single thyroid nodule: Secondary | ICD-10-CM | POA: Insufficient documentation

## 2015-10-24 LAB — THYROID PEROXIDASE ANTIBODY: THYROID PEROXIDASE ANTIBODY: 1 [IU]/mL (ref <9)

## 2015-10-24 MED ORDER — LEVOTHYROXINE SODIUM 75 MCG PO TABS: 75.0000 ug | ORAL_TABLET | Freq: Every day | ORAL | Status: AC

## 2015-10-30 DIAGNOSIS — S134XXA Sprain of ligaments of cervical spine, initial encounter: Secondary | ICD-10-CM | POA: Diagnosis not present

## 2015-10-30 DIAGNOSIS — M546 Pain in thoracic spine: Secondary | ICD-10-CM | POA: Diagnosis not present

## 2015-11-06 DIAGNOSIS — S134XXA Sprain of ligaments of cervical spine, initial encounter: Secondary | ICD-10-CM | POA: Diagnosis not present

## 2015-11-06 DIAGNOSIS — M546 Pain in thoracic spine: Secondary | ICD-10-CM | POA: Diagnosis not present

## 2015-12-16 DIAGNOSIS — N920 Excessive and frequent menstruation with regular cycle: Secondary | ICD-10-CM | POA: Diagnosis not present

## 2015-12-16 DIAGNOSIS — Z6839 Body mass index (BMI) 39.0-39.9, adult: Secondary | ICD-10-CM | POA: Diagnosis not present

## 2016-01-01 DIAGNOSIS — N939 Abnormal uterine and vaginal bleeding, unspecified: Secondary | ICD-10-CM | POA: Diagnosis not present

## 2016-02-04 DIAGNOSIS — N939 Abnormal uterine and vaginal bleeding, unspecified: Secondary | ICD-10-CM | POA: Diagnosis not present

## 2016-02-04 DIAGNOSIS — Z79899 Other long term (current) drug therapy: Secondary | ICD-10-CM | POA: Diagnosis not present

## 2016-02-04 DIAGNOSIS — N800 Endometriosis of uterus: Secondary | ICD-10-CM | POA: Diagnosis not present

## 2016-02-05 DIAGNOSIS — Z79899 Other long term (current) drug therapy: Secondary | ICD-10-CM | POA: Diagnosis not present

## 2016-02-05 DIAGNOSIS — N939 Abnormal uterine and vaginal bleeding, unspecified: Secondary | ICD-10-CM | POA: Diagnosis not present

## 2016-02-05 DIAGNOSIS — N800 Endometriosis of uterus: Secondary | ICD-10-CM | POA: Diagnosis not present

## 2016-02-06 DIAGNOSIS — N800 Endometriosis of uterus: Secondary | ICD-10-CM | POA: Diagnosis not present

## 2016-02-06 DIAGNOSIS — Z79899 Other long term (current) drug therapy: Secondary | ICD-10-CM | POA: Diagnosis not present

## 2016-02-06 DIAGNOSIS — N939 Abnormal uterine and vaginal bleeding, unspecified: Secondary | ICD-10-CM | POA: Diagnosis not present

## 2016-02-20 ENCOUNTER — Ambulatory Visit: Payer: 59 | Admitting: Internal Medicine

## 2016-02-20 DIAGNOSIS — Z0289 Encounter for other administrative examinations: Secondary | ICD-10-CM

## 2016-03-05 DIAGNOSIS — Z5181 Encounter for therapeutic drug level monitoring: Secondary | ICD-10-CM | POA: Diagnosis not present

## 2016-03-05 DIAGNOSIS — Z79899 Other long term (current) drug therapy: Secondary | ICD-10-CM | POA: Diagnosis not present

## 2016-03-15 DIAGNOSIS — G894 Chronic pain syndrome: Secondary | ICD-10-CM | POA: Diagnosis not present

## 2016-04-20 DIAGNOSIS — F410 Panic disorder [episodic paroxysmal anxiety] without agoraphobia: Secondary | ICD-10-CM | POA: Diagnosis not present

## 2016-04-20 DIAGNOSIS — F419 Anxiety disorder, unspecified: Secondary | ICD-10-CM | POA: Diagnosis not present

## 2016-04-20 DIAGNOSIS — F339 Major depressive disorder, recurrent, unspecified: Secondary | ICD-10-CM | POA: Diagnosis not present

## 2016-05-24 DIAGNOSIS — M5136 Other intervertebral disc degeneration, lumbar region: Secondary | ICD-10-CM | POA: Diagnosis not present

## 2016-05-24 DIAGNOSIS — F418 Other specified anxiety disorders: Secondary | ICD-10-CM | POA: Diagnosis not present

## 2016-05-24 DIAGNOSIS — K589 Irritable bowel syndrome without diarrhea: Secondary | ICD-10-CM | POA: Diagnosis not present

## 2016-05-24 DIAGNOSIS — G894 Chronic pain syndrome: Secondary | ICD-10-CM | POA: Diagnosis not present

## 2016-05-24 DIAGNOSIS — I10 Essential (primary) hypertension: Secondary | ICD-10-CM | POA: Diagnosis not present

## 2016-05-24 DIAGNOSIS — K219 Gastro-esophageal reflux disease without esophagitis: Secondary | ICD-10-CM | POA: Diagnosis not present

## 2016-06-17 DIAGNOSIS — M47817 Spondylosis without myelopathy or radiculopathy, lumbosacral region: Secondary | ICD-10-CM | POA: Diagnosis not present

## 2016-06-17 DIAGNOSIS — E039 Hypothyroidism, unspecified: Secondary | ICD-10-CM | POA: Diagnosis not present

## 2016-06-17 DIAGNOSIS — F329 Major depressive disorder, single episode, unspecified: Secondary | ICD-10-CM | POA: Diagnosis not present

## 2016-06-17 DIAGNOSIS — Z79899 Other long term (current) drug therapy: Secondary | ICD-10-CM | POA: Diagnosis not present

## 2016-06-17 DIAGNOSIS — G8929 Other chronic pain: Secondary | ICD-10-CM | POA: Diagnosis not present

## 2016-06-17 DIAGNOSIS — M792 Neuralgia and neuritis, unspecified: Secondary | ICD-10-CM | POA: Diagnosis not present

## 2016-06-17 DIAGNOSIS — M5136 Other intervertebral disc degeneration, lumbar region: Secondary | ICD-10-CM | POA: Diagnosis not present

## 2016-06-28 DIAGNOSIS — Z79891 Long term (current) use of opiate analgesic: Secondary | ICD-10-CM | POA: Diagnosis not present

## 2016-06-28 DIAGNOSIS — M5137 Other intervertebral disc degeneration, lumbosacral region: Secondary | ICD-10-CM | POA: Diagnosis not present

## 2016-06-28 DIAGNOSIS — M545 Low back pain: Secondary | ICD-10-CM | POA: Diagnosis not present

## 2016-06-28 DIAGNOSIS — M5127 Other intervertebral disc displacement, lumbosacral region: Secondary | ICD-10-CM | POA: Diagnosis not present

## 2016-06-28 DIAGNOSIS — G609 Hereditary and idiopathic neuropathy, unspecified: Secondary | ICD-10-CM | POA: Diagnosis not present

## 2016-07-01 DIAGNOSIS — R5383 Other fatigue: Secondary | ICD-10-CM | POA: Diagnosis not present

## 2016-07-01 DIAGNOSIS — R739 Hyperglycemia, unspecified: Secondary | ICD-10-CM | POA: Diagnosis not present

## 2016-07-01 DIAGNOSIS — Z23 Encounter for immunization: Secondary | ICD-10-CM | POA: Diagnosis not present

## 2016-07-08 DIAGNOSIS — M5416 Radiculopathy, lumbar region: Secondary | ICD-10-CM | POA: Diagnosis not present

## 2016-07-08 DIAGNOSIS — G603 Idiopathic progressive neuropathy: Secondary | ICD-10-CM | POA: Diagnosis not present

## 2016-07-12 DIAGNOSIS — M818 Other osteoporosis without current pathological fracture: Secondary | ICD-10-CM | POA: Diagnosis not present

## 2016-07-12 DIAGNOSIS — E559 Vitamin D deficiency, unspecified: Secondary | ICD-10-CM | POA: Diagnosis not present

## 2016-07-12 DIAGNOSIS — Z79899 Other long term (current) drug therapy: Secondary | ICD-10-CM | POA: Diagnosis not present

## 2016-07-12 DIAGNOSIS — E538 Deficiency of other specified B group vitamins: Secondary | ICD-10-CM | POA: Diagnosis not present

## 2016-07-12 DIAGNOSIS — R5383 Other fatigue: Secondary | ICD-10-CM | POA: Diagnosis not present

## 2016-07-15 DIAGNOSIS — G609 Hereditary and idiopathic neuropathy, unspecified: Secondary | ICD-10-CM | POA: Diagnosis not present

## 2016-07-15 DIAGNOSIS — M545 Low back pain: Secondary | ICD-10-CM | POA: Diagnosis not present

## 2016-07-28 DIAGNOSIS — M545 Low back pain: Secondary | ICD-10-CM | POA: Diagnosis not present

## 2016-07-28 DIAGNOSIS — G609 Hereditary and idiopathic neuropathy, unspecified: Secondary | ICD-10-CM | POA: Diagnosis not present

## 2016-07-28 DIAGNOSIS — M5127 Other intervertebral disc displacement, lumbosacral region: Secondary | ICD-10-CM | POA: Diagnosis not present

## 2016-07-28 DIAGNOSIS — Z79891 Long term (current) use of opiate analgesic: Secondary | ICD-10-CM | POA: Diagnosis not present

## 2016-07-28 DIAGNOSIS — G603 Idiopathic progressive neuropathy: Secondary | ICD-10-CM | POA: Diagnosis not present

## 2016-08-11 DIAGNOSIS — M545 Low back pain: Secondary | ICD-10-CM | POA: Diagnosis not present

## 2016-08-12 ENCOUNTER — Other Ambulatory Visit (HOSPITAL_COMMUNITY): Payer: Self-pay | Admitting: Specialist

## 2016-08-12 DIAGNOSIS — M545 Low back pain: Secondary | ICD-10-CM

## 2016-08-17 ENCOUNTER — Ambulatory Visit (HOSPITAL_COMMUNITY)
Admission: RE | Admit: 2016-08-17 | Discharge: 2016-08-17 | Disposition: A | Discharge status: Home / Self Care | Payer: 59 | Source: Ambulatory Visit | Attending: Specialist | Admitting: Specialist

## 2016-08-17 DIAGNOSIS — M5136 Other intervertebral disc degeneration, lumbar region: Secondary | ICD-10-CM | POA: Insufficient documentation

## 2016-08-17 DIAGNOSIS — M5126 Other intervertebral disc displacement, lumbar region: Secondary | ICD-10-CM | POA: Diagnosis not present

## 2016-08-17 DIAGNOSIS — M545 Low back pain: Secondary | ICD-10-CM | POA: Insufficient documentation

## 2016-08-20 DIAGNOSIS — M545 Low back pain: Secondary | ICD-10-CM | POA: Diagnosis not present

## 2016-08-23 ENCOUNTER — Other Ambulatory Visit: Payer: Self-pay | Admitting: Specialist

## 2016-08-23 DIAGNOSIS — M545 Low back pain, unspecified: Secondary | ICD-10-CM

## 2016-08-23 DIAGNOSIS — I10 Essential (primary) hypertension: Secondary | ICD-10-CM | POA: Diagnosis not present

## 2016-08-23 DIAGNOSIS — G603 Idiopathic progressive neuropathy: Secondary | ICD-10-CM | POA: Diagnosis not present

## 2016-08-23 DIAGNOSIS — E039 Hypothyroidism, unspecified: Secondary | ICD-10-CM | POA: Diagnosis not present

## 2016-08-23 DIAGNOSIS — F330 Major depressive disorder, recurrent, mild: Secondary | ICD-10-CM | POA: Diagnosis not present

## 2016-08-23 DIAGNOSIS — M5127 Other intervertebral disc displacement, lumbosacral region: Secondary | ICD-10-CM | POA: Diagnosis not present

## 2016-08-23 DIAGNOSIS — F319 Bipolar disorder, unspecified: Secondary | ICD-10-CM | POA: Diagnosis not present

## 2016-08-23 DIAGNOSIS — Z79891 Long term (current) use of opiate analgesic: Secondary | ICD-10-CM | POA: Diagnosis not present

## 2016-08-23 DIAGNOSIS — F419 Anxiety disorder, unspecified: Secondary | ICD-10-CM | POA: Diagnosis not present

## 2016-08-23 DIAGNOSIS — M5416 Radiculopathy, lumbar region: Secondary | ICD-10-CM | POA: Diagnosis not present

## 2016-09-07 ENCOUNTER — Ambulatory Visit
Admission: RE | Admit: 2016-09-07 | Discharge: 2016-09-07 | Disposition: A | Discharge status: Home / Self Care | Payer: 59 | Source: Ambulatory Visit | Attending: Specialist | Admitting: Specialist

## 2016-09-07 DIAGNOSIS — M5126 Other intervertebral disc displacement, lumbar region: Secondary | ICD-10-CM | POA: Diagnosis not present

## 2016-09-07 DIAGNOSIS — M545 Low back pain, unspecified: Secondary | ICD-10-CM

## 2016-09-07 MED ORDER — IOPAMIDOL (ISOVUE-M 200) INJECTION 41%
15.0000 mL | Freq: Once | INTRAMUSCULAR | Status: AC
  Administered 2016-09-07: 15 mL via INTRATHECAL

## 2016-09-07 MED ORDER — DIAZEPAM 5 MG PO TABS
10.0000 mg | ORAL_TABLET | Freq: Once | ORAL | Status: AC
  Administered 2016-09-07: 10 mg via ORAL

## 2016-09-07 NOTE — Progress Notes (Signed)
Pt states she has been off [place] and Cymbalta since Friday.

## 2016-09-07 NOTE — Discharge Instructions (Signed)
Myelogram Discharge Instructions  1. Go home and rest quietly for the next 24 hours.  It is important to lie flat for the next 24 hours.  Get up only to go to the restroom.  You may lie in the bed or on a couch on your back, your stomach, your left side or your right side.  You may have one pillow under your head.  You may have pillows between your knees while you are on your side or under your knees while you are on your back.  2. DO NOT drive today.  Recline the seat as far back as it will go, while still wearing your seat belt, on the way home.  3. You may get up to go to the bathroom as needed.  You may sit up for 10 minutes to eat.  You may resume your normal diet and medications unless otherwise indicated.  Drink lots of extra fluids today and tomorrow.  4. The incidence of headache, nausea, or vomiting is about 5% (one in 20 patients).  If you develop a headache, lie flat and drink plenty of fluids until the headache goes away.  Caffeinated beverages may be helpful.  If you develop severe nausea and vomiting or a headache that does not go away with flat bed rest, call [phone].  5. You may resume normal activities after your 24 hours of bed rest is over; however, do not exert yourself strongly or do any heavy lifting tomorrow. If when you get up you have a headache when standing, go back to bed and force fluids for another 24 hours.  6. Call your physician for a follow-up appointment.  The results of your myelogram will be sent directly to your physician by the following day.  7. If you have any questions or if complications develop after you arrive home, please call [phone].  Discharge instructions have been explained to the patient.  The patient, or the person responsible for the patient, fully understands these instructions.       May resume Cymbalta and Latuda on Dec. 6, 2017, after 9:30 am.

## 2016-09-14 DIAGNOSIS — M545 Low back pain: Secondary | ICD-10-CM | POA: Diagnosis not present

## 2016-09-21 DIAGNOSIS — G603 Idiopathic progressive neuropathy: Secondary | ICD-10-CM | POA: Diagnosis not present

## 2016-09-21 DIAGNOSIS — E039 Hypothyroidism, unspecified: Secondary | ICD-10-CM | POA: Diagnosis not present

## 2016-09-21 DIAGNOSIS — M5127 Other intervertebral disc displacement, lumbosacral region: Secondary | ICD-10-CM | POA: Diagnosis not present

## 2016-09-21 DIAGNOSIS — I10 Essential (primary) hypertension: Secondary | ICD-10-CM | POA: Diagnosis not present

## 2016-09-21 DIAGNOSIS — Z79891 Long term (current) use of opiate analgesic: Secondary | ICD-10-CM | POA: Diagnosis not present

## 2016-09-21 DIAGNOSIS — F319 Bipolar disorder, unspecified: Secondary | ICD-10-CM | POA: Diagnosis not present

## 2016-09-21 DIAGNOSIS — F330 Major depressive disorder, recurrent, mild: Secondary | ICD-10-CM | POA: Diagnosis not present

## 2016-09-21 DIAGNOSIS — F419 Anxiety disorder, unspecified: Secondary | ICD-10-CM | POA: Diagnosis not present

## 2016-09-21 DIAGNOSIS — M5416 Radiculopathy, lumbar region: Secondary | ICD-10-CM | POA: Diagnosis not present

## 2016-10-21 DIAGNOSIS — F330 Major depressive disorder, recurrent, mild: Secondary | ICD-10-CM | POA: Diagnosis not present

## 2016-10-21 DIAGNOSIS — G603 Idiopathic progressive neuropathy: Secondary | ICD-10-CM | POA: Diagnosis not present

## 2016-10-21 DIAGNOSIS — F419 Anxiety disorder, unspecified: Secondary | ICD-10-CM | POA: Diagnosis not present

## 2016-10-21 DIAGNOSIS — Z79891 Long term (current) use of opiate analgesic: Secondary | ICD-10-CM | POA: Diagnosis not present

## 2016-10-21 DIAGNOSIS — M5416 Radiculopathy, lumbar region: Secondary | ICD-10-CM | POA: Diagnosis not present

## 2016-10-21 DIAGNOSIS — E039 Hypothyroidism, unspecified: Secondary | ICD-10-CM | POA: Diagnosis not present

## 2016-10-21 DIAGNOSIS — F319 Bipolar disorder, unspecified: Secondary | ICD-10-CM | POA: Diagnosis not present

## 2016-10-21 DIAGNOSIS — M5127 Other intervertebral disc displacement, lumbosacral region: Secondary | ICD-10-CM | POA: Diagnosis not present

## 2016-10-21 DIAGNOSIS — I10 Essential (primary) hypertension: Secondary | ICD-10-CM | POA: Diagnosis not present

## 2016-12-16 DIAGNOSIS — F330 Major depressive disorder, recurrent, mild: Secondary | ICD-10-CM | POA: Diagnosis not present

## 2016-12-16 DIAGNOSIS — F419 Anxiety disorder, unspecified: Secondary | ICD-10-CM | POA: Diagnosis not present

## 2016-12-16 DIAGNOSIS — G603 Idiopathic progressive neuropathy: Secondary | ICD-10-CM | POA: Diagnosis not present

## 2016-12-16 DIAGNOSIS — M419 Scoliosis, unspecified: Secondary | ICD-10-CM | POA: Diagnosis not present

## 2016-12-16 DIAGNOSIS — F319 Bipolar disorder, unspecified: Secondary | ICD-10-CM | POA: Diagnosis not present

## 2016-12-16 DIAGNOSIS — I10 Essential (primary) hypertension: Secondary | ICD-10-CM | POA: Diagnosis not present

## 2016-12-16 DIAGNOSIS — M5127 Other intervertebral disc displacement, lumbosacral region: Secondary | ICD-10-CM | POA: Diagnosis not present

## 2016-12-16 DIAGNOSIS — E039 Hypothyroidism, unspecified: Secondary | ICD-10-CM | POA: Diagnosis not present

## 2016-12-16 DIAGNOSIS — Z79891 Long term (current) use of opiate analgesic: Secondary | ICD-10-CM | POA: Diagnosis not present

## 2016-12-16 DIAGNOSIS — M5416 Radiculopathy, lumbar region: Secondary | ICD-10-CM | POA: Diagnosis not present

## 2017-02-14 DIAGNOSIS — F331 Major depressive disorder, recurrent, moderate: Secondary | ICD-10-CM | POA: Diagnosis not present

## 2017-02-14 DIAGNOSIS — I10 Essential (primary) hypertension: Secondary | ICD-10-CM | POA: Diagnosis not present

## 2017-02-14 DIAGNOSIS — G603 Idiopathic progressive neuropathy: Secondary | ICD-10-CM | POA: Diagnosis not present

## 2017-02-14 DIAGNOSIS — Z79891 Long term (current) use of opiate analgesic: Secondary | ICD-10-CM | POA: Diagnosis not present

## 2017-02-14 DIAGNOSIS — E039 Hypothyroidism, unspecified: Secondary | ICD-10-CM | POA: Diagnosis not present

## 2017-02-14 DIAGNOSIS — F319 Bipolar disorder, unspecified: Secondary | ICD-10-CM | POA: Diagnosis not present

## 2017-02-14 DIAGNOSIS — F419 Anxiety disorder, unspecified: Secondary | ICD-10-CM | POA: Diagnosis not present

## 2017-02-14 DIAGNOSIS — M5127 Other intervertebral disc displacement, lumbosacral region: Secondary | ICD-10-CM | POA: Diagnosis not present

## 2017-02-14 DIAGNOSIS — M5416 Radiculopathy, lumbar region: Secondary | ICD-10-CM | POA: Diagnosis not present

## 2017-04-14 DIAGNOSIS — F419 Anxiety disorder, unspecified: Secondary | ICD-10-CM | POA: Diagnosis not present

## 2017-04-14 DIAGNOSIS — M5137 Other intervertebral disc degeneration, lumbosacral region: Secondary | ICD-10-CM | POA: Diagnosis not present

## 2017-04-14 DIAGNOSIS — I10 Essential (primary) hypertension: Secondary | ICD-10-CM | POA: Diagnosis not present

## 2017-04-14 DIAGNOSIS — Z79891 Long term (current) use of opiate analgesic: Secondary | ICD-10-CM | POA: Diagnosis not present

## 2017-04-14 DIAGNOSIS — E039 Hypothyroidism, unspecified: Secondary | ICD-10-CM | POA: Diagnosis not present

## 2017-04-14 DIAGNOSIS — G603 Idiopathic progressive neuropathy: Secondary | ICD-10-CM | POA: Diagnosis not present

## 2017-04-14 DIAGNOSIS — F330 Major depressive disorder, recurrent, mild: Secondary | ICD-10-CM | POA: Diagnosis not present

## 2017-04-14 DIAGNOSIS — M5416 Radiculopathy, lumbar region: Secondary | ICD-10-CM | POA: Diagnosis not present

## 2017-06-14 DIAGNOSIS — E039 Hypothyroidism, unspecified: Secondary | ICD-10-CM | POA: Diagnosis not present

## 2017-06-14 DIAGNOSIS — F419 Anxiety disorder, unspecified: Secondary | ICD-10-CM | POA: Diagnosis not present

## 2017-06-14 DIAGNOSIS — G603 Idiopathic progressive neuropathy: Secondary | ICD-10-CM | POA: Diagnosis not present

## 2017-06-14 DIAGNOSIS — M5416 Radiculopathy, lumbar region: Secondary | ICD-10-CM | POA: Diagnosis not present

## 2017-07-01 DIAGNOSIS — M62838 Other muscle spasm: Secondary | ICD-10-CM | POA: Diagnosis not present

## 2017-07-01 DIAGNOSIS — E039 Hypothyroidism, unspecified: Secondary | ICD-10-CM | POA: Diagnosis not present

## 2017-07-01 DIAGNOSIS — K219 Gastro-esophageal reflux disease without esophagitis: Secondary | ICD-10-CM | POA: Diagnosis not present

## 2017-07-01 DIAGNOSIS — Z23 Encounter for immunization: Secondary | ICD-10-CM | POA: Diagnosis not present

## 2017-07-01 DIAGNOSIS — I10 Essential (primary) hypertension: Secondary | ICD-10-CM | POA: Diagnosis not present

## 2017-07-01 DIAGNOSIS — F419 Anxiety disorder, unspecified: Secondary | ICD-10-CM | POA: Diagnosis not present

## 2017-07-01 DIAGNOSIS — F330 Major depressive disorder, recurrent, mild: Secondary | ICD-10-CM | POA: Diagnosis not present

## 2017-07-01 DIAGNOSIS — Z6836 Body mass index (BMI) 36.0-36.9, adult: Secondary | ICD-10-CM | POA: Diagnosis not present

## 2017-07-11 DIAGNOSIS — E039 Hypothyroidism, unspecified: Secondary | ICD-10-CM | POA: Diagnosis not present

## 2017-07-11 DIAGNOSIS — Z0000 Encounter for general adult medical examination without abnormal findings: Secondary | ICD-10-CM | POA: Diagnosis not present

## 2017-07-21 DIAGNOSIS — K219 Gastro-esophageal reflux disease without esophagitis: Secondary | ICD-10-CM | POA: Diagnosis not present

## 2017-07-21 DIAGNOSIS — E010 Iodine-deficiency related diffuse (endemic) goiter: Secondary | ICD-10-CM | POA: Diagnosis not present

## 2017-07-21 DIAGNOSIS — E039 Hypothyroidism, unspecified: Secondary | ICD-10-CM | POA: Diagnosis not present

## 2017-07-21 DIAGNOSIS — E782 Mixed hyperlipidemia: Secondary | ICD-10-CM | POA: Diagnosis not present

## 2017-07-21 DIAGNOSIS — F419 Anxiety disorder, unspecified: Secondary | ICD-10-CM | POA: Diagnosis not present

## 2017-07-21 DIAGNOSIS — M62838 Other muscle spasm: Secondary | ICD-10-CM | POA: Diagnosis not present

## 2017-07-21 DIAGNOSIS — F330 Major depressive disorder, recurrent, mild: Secondary | ICD-10-CM | POA: Diagnosis not present

## 2017-07-21 DIAGNOSIS — G9009 Other idiopathic peripheral autonomic neuropathy: Secondary | ICD-10-CM | POA: Diagnosis not present

## 2017-07-21 DIAGNOSIS — Z0000 Encounter for general adult medical examination without abnormal findings: Secondary | ICD-10-CM | POA: Diagnosis not present

## 2017-07-22 ENCOUNTER — Other Ambulatory Visit (HOSPITAL_COMMUNITY): Payer: Self-pay | Admitting: Internal Medicine

## 2017-07-22 DIAGNOSIS — E010 Iodine-deficiency related diffuse (endemic) goiter: Secondary | ICD-10-CM

## 2017-07-29 ENCOUNTER — Ambulatory Visit (HOSPITAL_COMMUNITY): Admission: RE | Admit: 2017-07-29 | Payer: 59 | Source: Ambulatory Visit

## 2017-08-01 ENCOUNTER — Ambulatory Visit (HOSPITAL_COMMUNITY)
Admission: RE | Admit: 2017-08-01 | Discharge: 2017-08-01 | Disposition: A | Discharge status: Home / Self Care | Payer: 59 | Source: Ambulatory Visit | Attending: Internal Medicine | Admitting: Internal Medicine

## 2017-08-01 DIAGNOSIS — E010 Iodine-deficiency related diffuse (endemic) goiter: Secondary | ICD-10-CM | POA: Insufficient documentation

## 2017-08-01 DIAGNOSIS — R042 Hemoptysis: Secondary | ICD-10-CM | POA: Insufficient documentation

## 2017-08-01 DIAGNOSIS — E041 Nontoxic single thyroid nodule: Secondary | ICD-10-CM | POA: Diagnosis not present

## 2017-08-18 ENCOUNTER — Other Ambulatory Visit: Payer: Self-pay | Admitting: Internal Medicine

## 2017-08-18 DIAGNOSIS — E041 Nontoxic single thyroid nodule: Secondary | ICD-10-CM

## 2017-08-19 ENCOUNTER — Other Ambulatory Visit (HOSPITAL_COMMUNITY)
Admission: RE | Admit: 2017-08-19 | Discharge: 2017-08-19 | Disposition: A | Discharge status: Home / Self Care | Payer: 59 | Source: Ambulatory Visit | Attending: Radiology | Admitting: Radiology

## 2017-08-19 ENCOUNTER — Ambulatory Visit
Admission: RE | Admit: 2017-08-19 | Discharge: 2017-08-19 | Disposition: A | Discharge status: Home / Self Care | Payer: 59 | Source: Ambulatory Visit | Attending: Internal Medicine | Admitting: Internal Medicine

## 2017-08-19 DIAGNOSIS — E041 Nontoxic single thyroid nodule: Secondary | ICD-10-CM | POA: Diagnosis not present

## 2017-08-19 DIAGNOSIS — D34 Benign neoplasm of thyroid gland: Secondary | ICD-10-CM | POA: Insufficient documentation

## 2017-09-02 DIAGNOSIS — M5416 Radiculopathy, lumbar region: Secondary | ICD-10-CM | POA: Diagnosis not present

## 2017-09-02 DIAGNOSIS — M545 Low back pain: Secondary | ICD-10-CM | POA: Diagnosis not present

## 2017-10-10 DIAGNOSIS — M5416 Radiculopathy, lumbar region: Secondary | ICD-10-CM | POA: Diagnosis not present

## 2017-10-10 DIAGNOSIS — Z79891 Long term (current) use of opiate analgesic: Secondary | ICD-10-CM | POA: Diagnosis not present

## 2017-10-10 DIAGNOSIS — M545 Low back pain: Secondary | ICD-10-CM | POA: Diagnosis not present

## 2017-10-10 DIAGNOSIS — G603 Idiopathic progressive neuropathy: Secondary | ICD-10-CM | POA: Diagnosis not present

## 2017-10-10 DIAGNOSIS — G894 Chronic pain syndrome: Secondary | ICD-10-CM | POA: Diagnosis not present

## 2017-11-07 DIAGNOSIS — W01198A Fall on same level from slipping, tripping and stumbling with subsequent striking against other object, initial encounter: Secondary | ICD-10-CM | POA: Diagnosis not present

## 2017-11-07 DIAGNOSIS — S0990XA Unspecified injury of head, initial encounter: Secondary | ICD-10-CM | POA: Diagnosis not present

## 2017-11-07 DIAGNOSIS — S0083XA Contusion of other part of head, initial encounter: Secondary | ICD-10-CM | POA: Diagnosis not present

## 2017-11-07 DIAGNOSIS — I10 Essential (primary) hypertension: Secondary | ICD-10-CM | POA: Diagnosis not present

## 2017-11-07 DIAGNOSIS — G609 Hereditary and idiopathic neuropathy, unspecified: Secondary | ICD-10-CM | POA: Diagnosis not present

## 2017-11-07 DIAGNOSIS — S0081XA Abrasion of other part of head, initial encounter: Secondary | ICD-10-CM | POA: Diagnosis not present

## 2017-11-07 DIAGNOSIS — M1990 Unspecified osteoarthritis, unspecified site: Secondary | ICD-10-CM | POA: Diagnosis not present

## 2017-11-07 DIAGNOSIS — F419 Anxiety disorder, unspecified: Secondary | ICD-10-CM | POA: Diagnosis not present

## 2017-11-07 DIAGNOSIS — E039 Hypothyroidism, unspecified: Secondary | ICD-10-CM | POA: Diagnosis not present

## 2017-11-07 DIAGNOSIS — F329 Major depressive disorder, single episode, unspecified: Secondary | ICD-10-CM | POA: Diagnosis not present

## 2017-11-07 DIAGNOSIS — R51 Headache: Secondary | ICD-10-CM | POA: Diagnosis not present

## 2017-11-24 DIAGNOSIS — E039 Hypothyroidism, unspecified: Secondary | ICD-10-CM | POA: Diagnosis not present

## 2017-11-24 DIAGNOSIS — I10 Essential (primary) hypertension: Secondary | ICD-10-CM | POA: Diagnosis not present

## 2017-11-25 DIAGNOSIS — E785 Hyperlipidemia, unspecified: Secondary | ICD-10-CM | POA: Diagnosis not present

## 2017-11-25 DIAGNOSIS — F419 Anxiety disorder, unspecified: Secondary | ICD-10-CM | POA: Diagnosis not present

## 2017-11-25 DIAGNOSIS — K219 Gastro-esophageal reflux disease without esophagitis: Secondary | ICD-10-CM | POA: Diagnosis not present

## 2017-11-25 DIAGNOSIS — Z6838 Body mass index (BMI) 38.0-38.9, adult: Secondary | ICD-10-CM | POA: Diagnosis not present

## 2017-11-25 DIAGNOSIS — E039 Hypothyroidism, unspecified: Secondary | ICD-10-CM | POA: Diagnosis not present

## 2017-11-25 DIAGNOSIS — I10 Essential (primary) hypertension: Secondary | ICD-10-CM | POA: Diagnosis not present

## 2017-11-25 DIAGNOSIS — M62838 Other muscle spasm: Secondary | ICD-10-CM | POA: Diagnosis not present

## 2017-12-07 DIAGNOSIS — M545 Low back pain: Secondary | ICD-10-CM | POA: Diagnosis not present

## 2017-12-07 DIAGNOSIS — M5416 Radiculopathy, lumbar region: Secondary | ICD-10-CM | POA: Diagnosis not present

## 2017-12-07 DIAGNOSIS — G603 Idiopathic progressive neuropathy: Secondary | ICD-10-CM | POA: Diagnosis not present

## 2017-12-07 DIAGNOSIS — Z79891 Long term (current) use of opiate analgesic: Secondary | ICD-10-CM | POA: Diagnosis not present

## 2018-01-06 DIAGNOSIS — Z79891 Long term (current) use of opiate analgesic: Secondary | ICD-10-CM | POA: Diagnosis not present

## 2018-01-06 DIAGNOSIS — M5416 Radiculopathy, lumbar region: Secondary | ICD-10-CM | POA: Diagnosis not present

## 2018-01-06 DIAGNOSIS — M545 Low back pain: Secondary | ICD-10-CM | POA: Diagnosis not present

## 2018-01-06 DIAGNOSIS — G603 Idiopathic progressive neuropathy: Secondary | ICD-10-CM | POA: Diagnosis not present

## 2018-04-03 DIAGNOSIS — M5416 Radiculopathy, lumbar region: Secondary | ICD-10-CM | POA: Diagnosis not present

## 2018-04-03 DIAGNOSIS — Z79891 Long term (current) use of opiate analgesic: Secondary | ICD-10-CM | POA: Diagnosis not present

## 2018-04-03 DIAGNOSIS — M545 Low back pain: Secondary | ICD-10-CM | POA: Diagnosis not present

## 2018-04-03 DIAGNOSIS — G603 Idiopathic progressive neuropathy: Secondary | ICD-10-CM | POA: Diagnosis not present

## 2018-05-09 ENCOUNTER — Ambulatory Visit (HOSPITAL_COMMUNITY): Payer: 59

## 2018-05-09 ENCOUNTER — Encounter (HOSPITAL_COMMUNITY): Payer: Self-pay

## 2018-05-16 ENCOUNTER — Ambulatory Visit (HOSPITAL_COMMUNITY): Payer: 59

## 2018-05-16 ENCOUNTER — Encounter (HOSPITAL_COMMUNITY): Payer: Self-pay

## 2018-05-16 ENCOUNTER — Telehealth (HOSPITAL_COMMUNITY): Payer: Self-pay | Admitting: Internal Medicine

## 2018-05-16 NOTE — Telephone Encounter (Signed)
05/16/18  pt left a message to cx said she had been sick for the last 3 days .Marland Kitchen.. she does want to reschedule

## 2018-06-01 ENCOUNTER — Telehealth (HOSPITAL_COMMUNITY): Payer: Self-pay | Admitting: Internal Medicine

## 2018-06-01 ENCOUNTER — Ambulatory Visit (HOSPITAL_COMMUNITY): Payer: 59

## 2018-06-01 NOTE — Telephone Encounter (Signed)
06/01/18  mom left a message that patient was having a rough day and wouldn't be here this afternoon

## 2018-06-02 DIAGNOSIS — M545 Low back pain: Secondary | ICD-10-CM | POA: Diagnosis not present

## 2018-06-02 DIAGNOSIS — R2689 Other abnormalities of gait and mobility: Secondary | ICD-10-CM | POA: Diagnosis not present

## 2018-06-02 DIAGNOSIS — G603 Idiopathic progressive neuropathy: Secondary | ICD-10-CM | POA: Diagnosis not present

## 2018-06-02 DIAGNOSIS — M5416 Radiculopathy, lumbar region: Secondary | ICD-10-CM | POA: Diagnosis not present

## 2018-08-02 DIAGNOSIS — M5416 Radiculopathy, lumbar region: Secondary | ICD-10-CM | POA: Diagnosis not present

## 2018-08-02 DIAGNOSIS — Z79891 Long term (current) use of opiate analgesic: Secondary | ICD-10-CM | POA: Diagnosis not present

## 2018-08-02 DIAGNOSIS — M545 Low back pain: Secondary | ICD-10-CM | POA: Diagnosis not present

## 2018-08-02 DIAGNOSIS — R2689 Other abnormalities of gait and mobility: Secondary | ICD-10-CM | POA: Diagnosis not present

## 2018-08-02 DIAGNOSIS — G603 Idiopathic progressive neuropathy: Secondary | ICD-10-CM | POA: Diagnosis not present

## 2018-09-21 DIAGNOSIS — G603 Idiopathic progressive neuropathy: Secondary | ICD-10-CM | POA: Diagnosis not present

## 2018-09-21 DIAGNOSIS — R2689 Other abnormalities of gait and mobility: Secondary | ICD-10-CM | POA: Diagnosis not present

## 2018-09-21 DIAGNOSIS — M545 Low back pain: Secondary | ICD-10-CM | POA: Diagnosis not present

## 2018-09-21 DIAGNOSIS — M5416 Radiculopathy, lumbar region: Secondary | ICD-10-CM | POA: Diagnosis not present

## 2018-10-23 DIAGNOSIS — E039 Hypothyroidism, unspecified: Secondary | ICD-10-CM | POA: Diagnosis not present

## 2018-10-23 DIAGNOSIS — I10 Essential (primary) hypertension: Secondary | ICD-10-CM | POA: Diagnosis not present

## 2018-10-23 DIAGNOSIS — Z6838 Body mass index (BMI) 38.0-38.9, adult: Secondary | ICD-10-CM | POA: Diagnosis not present

## 2018-10-23 DIAGNOSIS — K219 Gastro-esophageal reflux disease without esophagitis: Secondary | ICD-10-CM | POA: Diagnosis not present

## 2018-10-23 DIAGNOSIS — E785 Hyperlipidemia, unspecified: Secondary | ICD-10-CM | POA: Diagnosis not present

## 2018-10-27 DIAGNOSIS — I10 Essential (primary) hypertension: Secondary | ICD-10-CM | POA: Diagnosis not present

## 2018-10-27 DIAGNOSIS — F419 Anxiety disorder, unspecified: Secondary | ICD-10-CM | POA: Diagnosis not present

## 2018-10-27 DIAGNOSIS — E782 Mixed hyperlipidemia: Secondary | ICD-10-CM | POA: Diagnosis not present

## 2018-10-27 DIAGNOSIS — K219 Gastro-esophageal reflux disease without esophagitis: Secondary | ICD-10-CM | POA: Diagnosis not present

## 2018-10-27 DIAGNOSIS — R252 Cramp and spasm: Secondary | ICD-10-CM | POA: Diagnosis not present

## 2018-10-27 DIAGNOSIS — F39 Unspecified mood [affective] disorder: Secondary | ICD-10-CM | POA: Diagnosis not present

## 2018-10-27 DIAGNOSIS — E039 Hypothyroidism, unspecified: Secondary | ICD-10-CM | POA: Diagnosis not present

## 2018-10-27 DIAGNOSIS — M791 Myalgia, unspecified site: Secondary | ICD-10-CM | POA: Diagnosis not present

## 2018-11-17 IMAGING — CT CT L SPINE W/ CM
2 of 7 series · 5 of 14 positions shown, 6 images · non-contrast
Comparison: MRI 08/17/2016.

CLINICAL DATA: Lumbosacral spondylosis without myelopathy. Lumbago.
TECHNIQUE: Contiguous axial images were obtained through the Lumbar spine after
the intrathecal infusion of infusion. Coronal and sagittal
reconstructions were obtained of the axial image sets.

[Series 3: l spine soft · axial · 0.27mm/px · z∈[+761,+842]mm · 2 of 83 slices shown (1 of 2)]
[im 28/83  soft-tissue]
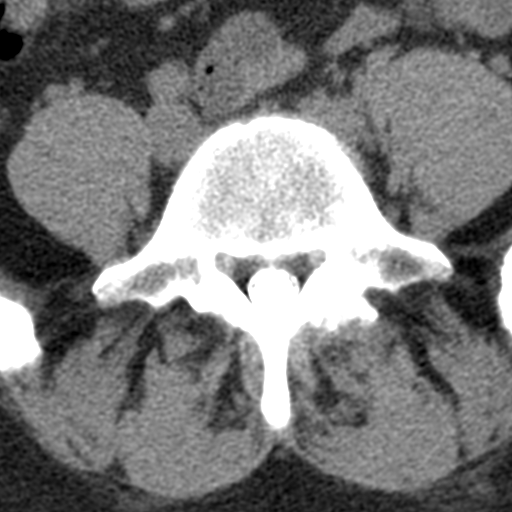
[im 55/83  soft-tissue]
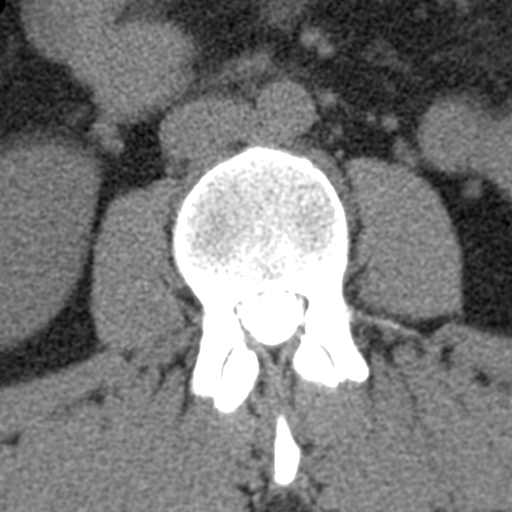

[Series 4: l spine soft · axial · 0.32mm/px · z∈[+740,+863]mm · 3 of 83 slices shown, 4 images (2 of 2)]
[im 21/83  soft-tissue]
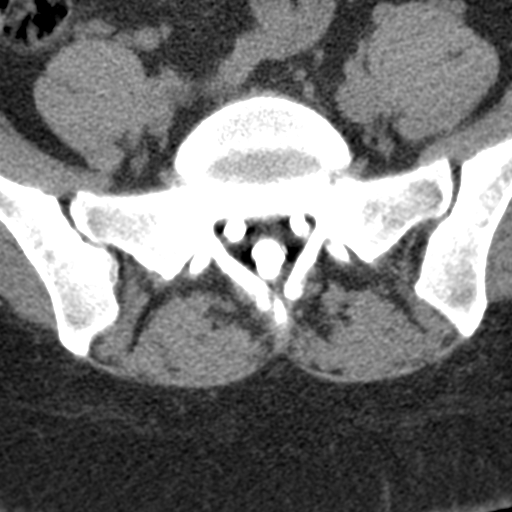
[im 21/83  bone]
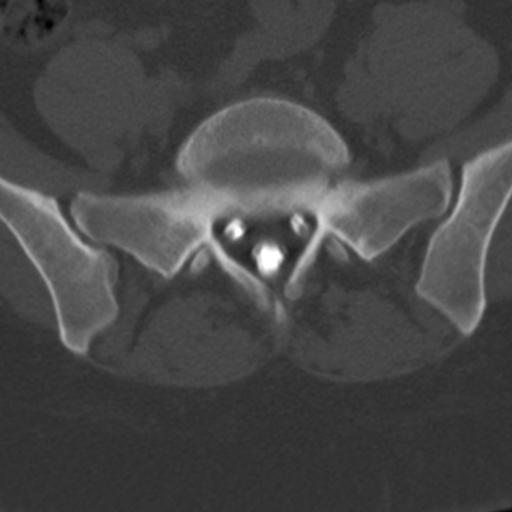
[im 42/83  bone]
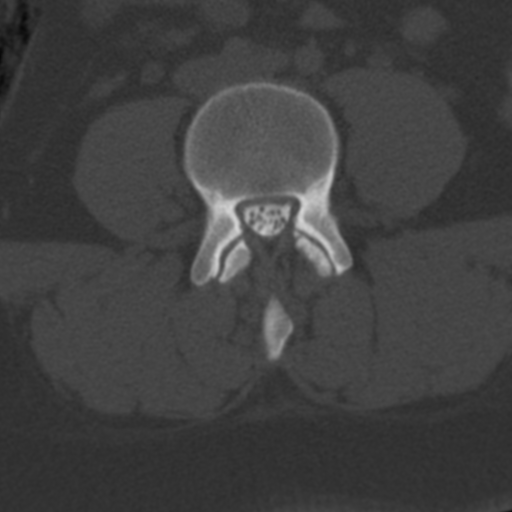
[im 62/83  bone]
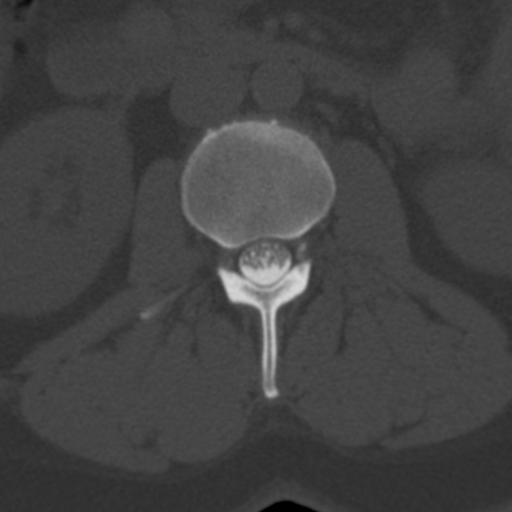

[5 of 14 positions shown; findings below may reference images not displayed]

EXAM:
LUMBAR MYELOGRAM

FLUOROSCOPY TIME:  20 seconds corresponding to a Dose Area Product
of 251.74 ?Gy*m2

PROCEDURE:
After thorough discussion of risks and benefits of the procedure
including bleeding, infection, injury to nerves, blood vessels,
adjacent structures as well as headache and CSF leak, written and
oral informed consent was obtained. Consent was obtained by Dr. Uriah
Marqulle. Time out form was completed.

Patient was positioned prone on the fluoroscopy table. Local
anesthesia was provided with 1% lidocaine without epinephrine after
prepped and draped in the usual sterile fashion. Puncture was
performed at L3-L4 using a 5 inch 22-gauge spinal needle via midline
approach. Using a single pass through the dura, the needle was
placed within the thecal sac, with return of clear CSF. 15 mL of
Isovue-M 200 was injected into the thecal sac, with normal
opacification of the nerve roots and cauda equina consistent with
free flow within the subarachnoid space.

I personally performed the lumbar puncture and administered the
intrathecal contrast. I also personally supervised acquisition of
the myelogram images.
FINDINGS: LUMBAR MYELOGRAM FINDINGS:

Good opacification lumbar subarachnoid space. Shallow ventral defect
at L4-5. Short pedicles contribute to mild narrowing of the thecal
sac at L4-5 but no nerve root cut off. Normal conus.

Upright AP films with side to side bending demonstrate no
exacerbation of subarticular zone narrowing.

Upright lateral radiographs in flexion extension re-demonstrate
shallow ventral defect at L4-5 without significant spinal stenosis.

CT LUMBAR MYELOGRAM FINDINGS:

Segmentation: Normal.

Alignment:  Normal.

Vertebrae: No worrisome osseous lesion.

Conus medullaris: Normal in size and location.

Paraspinal tissues: No evidence for hydronephrosis or paravertebral
mass.

Disc levels:

L1-L2:  Normal.

L2-L3:  Normal.

L3-L4:  Unremarkable disc space.  Short pedicles.  No impingement.

L4-L5: Ventral defect, correlating with prior MR, consists of a
shallow protrusion with annular tear. Short pedicles contribute to
mild stenosis but no subarticular zone or foraminal zone narrowing.
Mild facet arthropathy. No L5 nerve root compression.

L5-S1:  Annular bulge.  No impingement.

Compared with prior MR, a similar appearance is noted.
IMPRESSION: LUMBAR MYELOGRAM IMPRESSION:

Unremarkable lumbar myelogram. Ventral defect at L4-5 in conjunction
with congenital short pedicles contributes to borderline stenosis
but no subarticular zone narrowing.

No dynamic instability through lateral flexion extension or AP side
to side bending.

CT LUMBAR MYELOGRAM IMPRESSION:

Shallow protrusion at L4-5 is associated with congenitally short
pedicles, but no compressive lesion is established.

## 2018-11-23 DIAGNOSIS — M545 Low back pain: Secondary | ICD-10-CM | POA: Diagnosis not present

## 2018-11-23 DIAGNOSIS — Z79891 Long term (current) use of opiate analgesic: Secondary | ICD-10-CM | POA: Diagnosis not present

## 2018-11-23 DIAGNOSIS — M5416 Radiculopathy, lumbar region: Secondary | ICD-10-CM | POA: Diagnosis not present

## 2018-11-23 DIAGNOSIS — G603 Idiopathic progressive neuropathy: Secondary | ICD-10-CM | POA: Diagnosis not present

## 2018-11-23 DIAGNOSIS — R2689 Other abnormalities of gait and mobility: Secondary | ICD-10-CM | POA: Diagnosis not present

## 2019-01-29 DIAGNOSIS — M79603 Pain in arm, unspecified: Secondary | ICD-10-CM | POA: Diagnosis not present

## 2019-01-29 DIAGNOSIS — M545 Low back pain: Secondary | ICD-10-CM | POA: Diagnosis not present

## 2019-01-29 DIAGNOSIS — M79606 Pain in leg, unspecified: Secondary | ICD-10-CM | POA: Diagnosis not present

## 2019-01-29 DIAGNOSIS — R2689 Other abnormalities of gait and mobility: Secondary | ICD-10-CM | POA: Diagnosis not present

## 2019-02-27 DIAGNOSIS — M79606 Pain in leg, unspecified: Secondary | ICD-10-CM | POA: Diagnosis not present

## 2019-02-27 DIAGNOSIS — M5416 Radiculopathy, lumbar region: Secondary | ICD-10-CM | POA: Diagnosis not present

## 2019-02-27 DIAGNOSIS — R2689 Other abnormalities of gait and mobility: Secondary | ICD-10-CM | POA: Diagnosis not present

## 2019-02-27 DIAGNOSIS — M79603 Pain in arm, unspecified: Secondary | ICD-10-CM | POA: Diagnosis not present

## 2019-02-27 DIAGNOSIS — M545 Low back pain: Secondary | ICD-10-CM | POA: Diagnosis not present

## 2019-05-23 DIAGNOSIS — M545 Low back pain: Secondary | ICD-10-CM | POA: Diagnosis not present

## 2019-05-23 DIAGNOSIS — M79606 Pain in leg, unspecified: Secondary | ICD-10-CM | POA: Diagnosis not present

## 2019-05-23 DIAGNOSIS — R2689 Other abnormalities of gait and mobility: Secondary | ICD-10-CM | POA: Diagnosis not present

## 2019-05-23 DIAGNOSIS — M79603 Pain in arm, unspecified: Secondary | ICD-10-CM | POA: Diagnosis not present

## 2019-05-28 DIAGNOSIS — M79672 Pain in left foot: Secondary | ICD-10-CM | POA: Diagnosis not present

## 2019-05-28 DIAGNOSIS — M25579 Pain in unspecified ankle and joints of unspecified foot: Secondary | ICD-10-CM | POA: Diagnosis not present

## 2019-05-28 DIAGNOSIS — M79671 Pain in right foot: Secondary | ICD-10-CM | POA: Diagnosis not present

## 2019-06-18 DIAGNOSIS — M79672 Pain in left foot: Secondary | ICD-10-CM | POA: Diagnosis not present

## 2019-06-18 DIAGNOSIS — M79671 Pain in right foot: Secondary | ICD-10-CM | POA: Diagnosis not present

## 2019-06-18 DIAGNOSIS — M25579 Pain in unspecified ankle and joints of unspecified foot: Secondary | ICD-10-CM | POA: Diagnosis not present

## 2019-07-10 DIAGNOSIS — M25579 Pain in unspecified ankle and joints of unspecified foot: Secondary | ICD-10-CM | POA: Diagnosis not present

## 2019-07-10 DIAGNOSIS — M79671 Pain in right foot: Secondary | ICD-10-CM | POA: Diagnosis not present

## 2019-07-10 DIAGNOSIS — M79672 Pain in left foot: Secondary | ICD-10-CM | POA: Diagnosis not present

## 2019-08-07 DIAGNOSIS — M79671 Pain in right foot: Secondary | ICD-10-CM | POA: Diagnosis not present

## 2019-08-07 DIAGNOSIS — M79672 Pain in left foot: Secondary | ICD-10-CM | POA: Diagnosis not present

## 2019-08-07 DIAGNOSIS — M25579 Pain in unspecified ankle and joints of unspecified foot: Secondary | ICD-10-CM | POA: Diagnosis not present

## 2019-08-28 DIAGNOSIS — Z79891 Long term (current) use of opiate analgesic: Secondary | ICD-10-CM | POA: Diagnosis not present

## 2019-08-28 DIAGNOSIS — R2689 Other abnormalities of gait and mobility: Secondary | ICD-10-CM | POA: Diagnosis not present

## 2019-08-28 DIAGNOSIS — M545 Low back pain: Secondary | ICD-10-CM | POA: Diagnosis not present

## 2019-08-28 DIAGNOSIS — M79606 Pain in leg, unspecified: Secondary | ICD-10-CM | POA: Diagnosis not present

## 2019-08-28 DIAGNOSIS — M79603 Pain in arm, unspecified: Secondary | ICD-10-CM | POA: Diagnosis not present

## 2019-09-04 DIAGNOSIS — M79672 Pain in left foot: Secondary | ICD-10-CM | POA: Diagnosis not present

## 2019-09-04 DIAGNOSIS — M25579 Pain in unspecified ankle and joints of unspecified foot: Secondary | ICD-10-CM | POA: Diagnosis not present

## 2019-09-04 DIAGNOSIS — M79671 Pain in right foot: Secondary | ICD-10-CM | POA: Diagnosis not present

## 2019-10-15 DIAGNOSIS — M25579 Pain in unspecified ankle and joints of unspecified foot: Secondary | ICD-10-CM | POA: Diagnosis not present

## 2019-10-15 DIAGNOSIS — M79672 Pain in left foot: Secondary | ICD-10-CM | POA: Diagnosis not present

## 2019-10-15 DIAGNOSIS — M79671 Pain in right foot: Secondary | ICD-10-CM | POA: Diagnosis not present

## 2019-11-20 DIAGNOSIS — M79603 Pain in arm, unspecified: Secondary | ICD-10-CM | POA: Diagnosis not present

## 2019-11-20 DIAGNOSIS — M545 Low back pain: Secondary | ICD-10-CM | POA: Diagnosis not present

## 2019-11-20 DIAGNOSIS — R2689 Other abnormalities of gait and mobility: Secondary | ICD-10-CM | POA: Diagnosis not present

## 2019-11-20 DIAGNOSIS — M79606 Pain in leg, unspecified: Secondary | ICD-10-CM | POA: Diagnosis not present

## 2019-12-05 DIAGNOSIS — I10 Essential (primary) hypertension: Secondary | ICD-10-CM | POA: Diagnosis not present

## 2019-12-05 DIAGNOSIS — E785 Hyperlipidemia, unspecified: Secondary | ICD-10-CM | POA: Diagnosis not present

## 2019-12-05 DIAGNOSIS — K219 Gastro-esophageal reflux disease without esophagitis: Secondary | ICD-10-CM | POA: Diagnosis not present

## 2019-12-05 DIAGNOSIS — Z712 Person consulting for explanation of examination or test findings: Secondary | ICD-10-CM | POA: Diagnosis not present

## 2019-12-05 DIAGNOSIS — F419 Anxiety disorder, unspecified: Secondary | ICD-10-CM | POA: Diagnosis not present

## 2019-12-05 DIAGNOSIS — Z6838 Body mass index (BMI) 38.0-38.9, adult: Secondary | ICD-10-CM | POA: Diagnosis not present

## 2019-12-05 DIAGNOSIS — F330 Major depressive disorder, recurrent, mild: Secondary | ICD-10-CM | POA: Diagnosis not present

## 2019-12-05 DIAGNOSIS — M62838 Other muscle spasm: Secondary | ICD-10-CM | POA: Diagnosis not present

## 2019-12-05 DIAGNOSIS — E039 Hypothyroidism, unspecified: Secondary | ICD-10-CM | POA: Diagnosis not present

## 2019-12-13 DIAGNOSIS — M62838 Other muscle spasm: Secondary | ICD-10-CM | POA: Diagnosis not present

## 2019-12-13 DIAGNOSIS — E039 Hypothyroidism, unspecified: Secondary | ICD-10-CM | POA: Diagnosis not present

## 2019-12-13 DIAGNOSIS — E6609 Other obesity due to excess calories: Secondary | ICD-10-CM | POA: Diagnosis not present

## 2019-12-13 DIAGNOSIS — Z712 Person consulting for explanation of examination or test findings: Secondary | ICD-10-CM | POA: Diagnosis not present

## 2019-12-13 DIAGNOSIS — Z6838 Body mass index (BMI) 38.0-38.9, adult: Secondary | ICD-10-CM | POA: Diagnosis not present

## 2019-12-13 DIAGNOSIS — I10 Essential (primary) hypertension: Secondary | ICD-10-CM | POA: Diagnosis not present

## 2019-12-13 DIAGNOSIS — F419 Anxiety disorder, unspecified: Secondary | ICD-10-CM | POA: Diagnosis not present

## 2019-12-13 DIAGNOSIS — K219 Gastro-esophageal reflux disease without esophagitis: Secondary | ICD-10-CM | POA: Diagnosis not present

## 2019-12-13 DIAGNOSIS — F330 Major depressive disorder, recurrent, mild: Secondary | ICD-10-CM | POA: Diagnosis not present

## 2019-12-13 DIAGNOSIS — E785 Hyperlipidemia, unspecified: Secondary | ICD-10-CM | POA: Diagnosis not present

## 2020-01-14 DIAGNOSIS — E785 Hyperlipidemia, unspecified: Secondary | ICD-10-CM | POA: Diagnosis not present

## 2020-01-14 DIAGNOSIS — Z6838 Body mass index (BMI) 38.0-38.9, adult: Secondary | ICD-10-CM | POA: Diagnosis not present

## 2020-01-14 DIAGNOSIS — E6609 Other obesity due to excess calories: Secondary | ICD-10-CM | POA: Diagnosis not present

## 2020-01-14 DIAGNOSIS — M62838 Other muscle spasm: Secondary | ICD-10-CM | POA: Diagnosis not present

## 2020-01-14 DIAGNOSIS — I10 Essential (primary) hypertension: Secondary | ICD-10-CM | POA: Diagnosis not present

## 2020-01-14 DIAGNOSIS — F419 Anxiety disorder, unspecified: Secondary | ICD-10-CM | POA: Diagnosis not present

## 2020-01-14 DIAGNOSIS — K219 Gastro-esophageal reflux disease without esophagitis: Secondary | ICD-10-CM | POA: Diagnosis not present

## 2020-01-14 DIAGNOSIS — F330 Major depressive disorder, recurrent, mild: Secondary | ICD-10-CM | POA: Diagnosis not present

## 2020-01-14 DIAGNOSIS — E039 Hypothyroidism, unspecified: Secondary | ICD-10-CM | POA: Diagnosis not present

## 2020-02-12 DIAGNOSIS — M5416 Radiculopathy, lumbar region: Secondary | ICD-10-CM | POA: Diagnosis not present

## 2020-02-12 DIAGNOSIS — R2689 Other abnormalities of gait and mobility: Secondary | ICD-10-CM | POA: Diagnosis not present

## 2020-02-12 DIAGNOSIS — G609 Hereditary and idiopathic neuropathy, unspecified: Secondary | ICD-10-CM | POA: Diagnosis not present

## 2020-02-12 DIAGNOSIS — M79603 Pain in arm, unspecified: Secondary | ICD-10-CM | POA: Diagnosis not present

## 2020-02-12 DIAGNOSIS — M545 Low back pain: Secondary | ICD-10-CM | POA: Diagnosis not present

## 2020-02-12 DIAGNOSIS — M79606 Pain in leg, unspecified: Secondary | ICD-10-CM | POA: Diagnosis not present

## 2020-02-12 DIAGNOSIS — F341 Dysthymic disorder: Secondary | ICD-10-CM | POA: Diagnosis not present

## 2020-02-12 DIAGNOSIS — Z79891 Long term (current) use of opiate analgesic: Secondary | ICD-10-CM | POA: Diagnosis not present

## 2020-05-06 DIAGNOSIS — G609 Hereditary and idiopathic neuropathy, unspecified: Secondary | ICD-10-CM | POA: Diagnosis not present

## 2020-05-06 DIAGNOSIS — Z79891 Long term (current) use of opiate analgesic: Secondary | ICD-10-CM | POA: Diagnosis not present

## 2020-05-06 DIAGNOSIS — M79606 Pain in leg, unspecified: Secondary | ICD-10-CM | POA: Diagnosis not present

## 2020-05-06 DIAGNOSIS — R2689 Other abnormalities of gait and mobility: Secondary | ICD-10-CM | POA: Diagnosis not present

## 2020-05-06 DIAGNOSIS — M5416 Radiculopathy, lumbar region: Secondary | ICD-10-CM | POA: Diagnosis not present

## 2020-05-06 DIAGNOSIS — M79603 Pain in arm, unspecified: Secondary | ICD-10-CM | POA: Diagnosis not present

## 2020-05-06 DIAGNOSIS — F341 Dysthymic disorder: Secondary | ICD-10-CM | POA: Diagnosis not present

## 2020-05-06 DIAGNOSIS — M545 Low back pain: Secondary | ICD-10-CM | POA: Diagnosis not present

## 2020-05-30 ENCOUNTER — Ambulatory Visit: Payer: Self-pay

## 2020-06-26 ENCOUNTER — Ambulatory Visit: Payer: 59 | Attending: Internal Medicine

## 2020-06-26 DIAGNOSIS — Z23 Encounter for immunization: Secondary | ICD-10-CM

## 2020-06-26 NOTE — Progress Notes (Signed)
   Covid-19 Vaccination Clinic  Name:  KAISLEE CHAO    MRN: [id number] DOB: 01/13/1981  06/26/2020  Ms. Shaver was observed post Covid-19 immunization for 15 minutes without incident. She was provided with Vaccine Information Sheet and instruction to access the V-Safe system.   Ms. Brekke was instructed to call 911 with any severe reactions post vaccine: Marland Kitchen Difficulty breathing  . Swelling of face and throat  . A fast heartbeat  . A bad rash all over body  . Dizziness and weakness   Immunizations Administered    Name Date Dose VIS Date Route   Pfizer COVID-19 Vaccine 06/26/2020  3:18 PM 0.3 mL 11/28/2018 Intramuscular   Manufacturer: [employer]   Lot: [id number]   [place]: [id number]

## 2020-07-17 ENCOUNTER — Ambulatory Visit: Payer: 59 | Attending: Internal Medicine

## 2020-07-17 DIAGNOSIS — Z23 Encounter for immunization: Secondary | ICD-10-CM

## 2020-07-17 NOTE — Progress Notes (Signed)
   Covid-19 Vaccination Clinic  Name:  Amy Wang    MRN: [id number] DOB: 1981-09-08  07/17/2020  [name] was observed post Covid-19 immunization for 15 minutes without incident. She was provided with Vaccine Information Sheet and instruction to access the V-Safe system.   [name] was instructed to call 911 with any severe reactions post vaccine: Marland Kitchen Difficulty breathing  . Swelling of face and throat  . A fast heartbeat  . A bad rash all over body  . Dizziness and weakness   Immunizations Administered    Name Date Dose VIS Date Route   Pfizer COVID-19 Vaccine 07/17/2020  3:14 PM 0.3 mL 11/28/2018 Intramuscular   Manufacturer: [employer]   Lot: [id number]   [place]: [id number]

## 2020-08-04 ENCOUNTER — Other Ambulatory Visit (HOSPITAL_COMMUNITY): Payer: Self-pay | Admitting: Neurology

## 2020-08-04 DIAGNOSIS — M79603 Pain in arm, unspecified: Secondary | ICD-10-CM | POA: Diagnosis not present

## 2020-08-04 DIAGNOSIS — Z79891 Long term (current) use of opiate analgesic: Secondary | ICD-10-CM | POA: Diagnosis not present

## 2020-08-04 DIAGNOSIS — M5416 Radiculopathy, lumbar region: Secondary | ICD-10-CM | POA: Diagnosis not present

## 2020-08-04 DIAGNOSIS — M79606 Pain in leg, unspecified: Secondary | ICD-10-CM | POA: Diagnosis not present

## 2020-08-04 DIAGNOSIS — R2689 Other abnormalities of gait and mobility: Secondary | ICD-10-CM | POA: Diagnosis not present

## 2020-08-04 DIAGNOSIS — M545 Low back pain, unspecified: Secondary | ICD-10-CM | POA: Diagnosis not present

## 2020-08-04 DIAGNOSIS — F341 Dysthymic disorder: Secondary | ICD-10-CM | POA: Diagnosis not present

## 2020-08-04 DIAGNOSIS — G609 Hereditary and idiopathic neuropathy, unspecified: Secondary | ICD-10-CM | POA: Diagnosis not present

## 2020-09-21 DIAGNOSIS — E162 Hypoglycemia, unspecified: Secondary | ICD-10-CM | POA: Diagnosis not present

## 2020-09-21 DIAGNOSIS — E161 Other hypoglycemia: Secondary | ICD-10-CM | POA: Diagnosis not present

## 2020-09-21 DIAGNOSIS — R55 Syncope and collapse: Secondary | ICD-10-CM | POA: Diagnosis not present

## 2020-09-21 DIAGNOSIS — R457 State of emotional shock and stress, unspecified: Secondary | ICD-10-CM | POA: Diagnosis not present

## 2020-09-21 DIAGNOSIS — I10 Essential (primary) hypertension: Secondary | ICD-10-CM | POA: Diagnosis not present

## 2020-10-06 ENCOUNTER — Ambulatory Visit: Payer: Self-pay

## 2020-10-11 ENCOUNTER — Ambulatory Visit
Admission: EM | Admit: 2020-10-11 | Discharge: 2020-10-11 | Disposition: A | Discharge status: Home / Self Care | Payer: BC Managed Care – PPO | Attending: Family Medicine | Admitting: Family Medicine

## 2020-10-11 DIAGNOSIS — N3001 Acute cystitis with hematuria: Secondary | ICD-10-CM | POA: Diagnosis not present

## 2020-10-11 DIAGNOSIS — R000 Tachycardia, unspecified: Secondary | ICD-10-CM | POA: Diagnosis not present

## 2020-10-11 DIAGNOSIS — F430 Acute stress reaction: Secondary | ICD-10-CM | POA: Diagnosis not present

## 2020-10-11 DIAGNOSIS — R300 Dysuria: Secondary | ICD-10-CM | POA: Diagnosis not present

## 2020-10-11 DIAGNOSIS — R410 Disorientation, unspecified: Secondary | ICD-10-CM | POA: Diagnosis not present

## 2020-10-11 LAB — CBC WITH DIFFERENTIAL/PLATELET
Basophils Absolute: 0.1 10*3/uL (ref 0.0–0.2)
Basos: 1 %
EOS (ABSOLUTE): 0.1 10*3/uL (ref 0.0–0.4)
Eos: 1 %
Hematocrit: 42.3 % (ref 34.0–46.6)
Hemoglobin: 14.7 g/dL (ref 11.1–15.9)
Immature Grans (Abs): 0 10*3/uL (ref 0.0–0.1)
Immature Granulocytes: 0 %
Lymphocytes Absolute: 3.2 10*3/uL — ABNORMAL HIGH (ref 0.7–3.1)
Lymphs: 30 %
MCH: 31.9 pg (ref 26.6–33.0)
MCHC: 34.8 g/dL (ref 31.5–35.7)
MCV: 92 fL (ref 79–97)
Monocytes Absolute: 0.6 10*3/uL (ref 0.1–0.9)
Monocytes: 6 %
Neutrophils Absolute: 6.5 10*3/uL (ref 1.4–7.0)
Neutrophils: 62 %
Platelets: 355 10*3/uL (ref 150–450)
RBC: 4.61 x10E6/uL (ref 3.77–5.28)
RDW: 12.6 % (ref 11.7–15.4)
WBC: 10.5 10*3/uL (ref 3.4–10.8)

## 2020-10-11 LAB — COMPREHENSIVE METABOLIC PANEL
ALT: 21 IU/L (ref 0–32)
AST: 17 IU/L (ref 0–40)
Albumin/Globulin Ratio: 1.8 (ref 1.2–2.2)
Albumin: 5 g/dL — ABNORMAL HIGH (ref 3.8–4.8)
Alkaline Phosphatase: 97 IU/L (ref 44–121)
BUN/Creatinine Ratio: 12 (ref 9–23)
BUN: 10 mg/dL (ref 6–24)
Bilirubin Total: 0.7 mg/dL (ref 0.0–1.2)
CO2: 20 mmol/L (ref 20–29)
Calcium: 9.6 mg/dL (ref 8.7–10.2)
Chloride: 99 mmol/L (ref 96–106)
Creatinine, Ser: 0.82 mg/dL (ref 0.57–1.00)
GFR calc Af Amer: 103 mL/min/{1.73_m2} (ref >59)
GFR calc non Af Amer: 89 mL/min/{1.73_m2} (ref >59)
Globulin, Total: 2.8 g/dL (ref 1.5–4.5)
Glucose: 110 mg/dL — ABNORMAL HIGH (ref 65–99)
Potassium: 4.7 mmol/L (ref 3.5–5.2)
Sodium: 137 mmol/L (ref 134–144)
Total Protein: 7.8 g/dL (ref 6.0–8.5)

## 2020-10-11 LAB — POCT URINALYSIS DIP (MANUAL ENTRY)
Bilirubin, UA: NEGATIVE
Glucose, UA: NEGATIVE mg/dL
Ketones, POC UA: NEGATIVE mg/dL
Nitrite, UA: POSITIVE — AB
Protein Ur, POC: 100 mg/dL — AB
Spec Grav, UA: >=1.03 — AB (ref 1.010–1.025)
Urobilinogen, UA: 0.2 E.U./dL
pH, UA: 5.5 (ref 5.0–8.0)

## 2020-10-11 MED ORDER — CIPROFLOXACIN HCL 500 MG PO TABS: 500.0000 mg | ORAL_TABLET | Freq: Two times a day (BID) | ORAL | 0 refills | Status: DC

## 2020-10-11 MED ORDER — CEFTRIAXONE SODIUM 1 G IJ SOLR
1.0000 g | Freq: Once | INTRAMUSCULAR | Status: AC
  Administered 2020-10-11: 1 g via INTRAMUSCULAR

## 2020-10-11 NOTE — Discharge Instructions (Addendum)
I have sent in Cipro twice a day for 7 days  We have given you an antibiotic injection  Lab work is pending  We will let you know of any abnormal results   If labs show signs of sepsis, she will need to go the ER for further evaluation and treatment  Follow up with this office or with primary care if symptoms are persisting  Follow up in the ER for high fever, trouble swallowing, trouble breathing, other concerning symptoms

## 2020-10-11 NOTE — ED Triage Notes (Signed)
Pt presents with lower abdominal pain and dysuria that began Sunday

## 2020-10-12 ENCOUNTER — Ambulatory Visit
Admission: RE | Admit: 2020-10-12 | Discharge: 2020-10-12 | Disposition: A | Discharge status: Home / Self Care | Payer: BC Managed Care – PPO | Source: Ambulatory Visit | Attending: Internal Medicine | Admitting: Internal Medicine

## 2020-10-12 ENCOUNTER — Ambulatory Visit
Admission: EM | Admit: 2020-10-12 | Discharge: 2020-10-12 | Disposition: A | Discharge status: Home / Self Care | Payer: Self-pay

## 2020-10-12 ENCOUNTER — Other Ambulatory Visit: Payer: Self-pay

## 2020-10-12 DIAGNOSIS — N3001 Acute cystitis with hematuria: Secondary | ICD-10-CM

## 2020-10-13 LAB — URINE CULTURE: Culture: >=100000 — AB

## 2020-10-13 NOTE — ED Provider Notes (Signed)
[facility]    CSN: [id number] Arrival date & time: 10/11/20  1013      History   Chief Complaint Chief Complaint  Patient presents with  . Dysuria    HPI Amy Wang is a 40 y.o. female.    Amy Wang is a 40 y.o. female who complains of urinary frequency, urgency and dysuria for the past three weeks. Patient denies a precipitating event, recent sexual encounter, excessive caffeine intake. Localizes the pain to the lower abdomen and bilateral flanks. Pain is intermittent and describes it as burning. Has not tried OTC medications without relief.  Symptoms are made worse with urination. Patient has hx peripheral neuropathy, anxiety, depression. Reports to increased stress as her mother in the hospital at this time. She is tearful during the visit. Admits to similar symptoms in the past.  Denies fever, chills, vomiting, abdominal pain, abnormal vaginal discharge or bleeding, hematuria.    ROS per HPI  The history is provided by the patient.  Dysuria   Past Medical History:  Diagnosis Date  . Anxiety   . Arthritis    back  . Bulging lumbar disc 01/2013  . GERD (gastroesophageal reflux disease)   . IBS (irritable bowel syndrome)     Patient Active Problem List   Diagnosis Date Noted  . Hypothyroidism 10/24/2015  . Thyroid cyst 10/24/2015    Past Surgical History:  Procedure Laterality Date  . BREAST SURGERY    . BUNIONECTOMY Right   . COLONOSCOPY N/A 11/22/2013   Procedure: COLONOSCOPY;  Surgeon: Rogene Houston, MD;  Location: [facility];  Service: Endoscopy;  Laterality: N/A;  1030  . COSMETIC SURGERY    . TONSILLECTOMY     as child  . tummy tuck  2010    OB History   No obstetric history on file.      Home Medications    Prior to Admission medications   Medication Sig Start Date End Date Taking? Authorizing Provider  ALPRAZolam Duanne Moron) 1 MG tablet Take 1 mg by mouth 3 (three) times daily as needed for anxiety.     [provider]  Carbamazepine (EQUETRO) 100 MG CP12 12 hr capsule Take by mouth.    [provider]  ciprofloxacin (CIPRO) 500 MG tablet Take 1 tablet (500 mg total) by mouth 2 (two) times daily. 10/11/20   Faustino Congress, NP  DULoxetine (CYMBALTA) 30 MG capsule Take 90 mg by mouth every morning.    [provider]  levothyroxine (SYNTHROID, LEVOTHROID) 75 MCG tablet Take 1 tablet (75 mcg total) by mouth daily. 10/24/15   Philemon Kingdom, MD  lurasidone (LATUDA) 20 MG TABS tablet Take by mouth.    [provider]  metoprolol succinate (TOPROL-XL) 25 MG 24 hr tablet Take 1 tablet (25 mg total) by mouth daily. 09/27/15   Melony Overly, MD  Multiple Vitamin (MULTIVITAMIN WITH MINERALS) TABS tablet Take 1 tablet by mouth daily.    [provider]  oxyCODONE-acetaminophen (PERCOCET) 7.5-325 MG tablet take 1 tablet by mouth every 6 hours if needed for severe pain 10/16/15   [provider]  Probiotic Product (ALIGN PO) Take 1 capsule by mouth daily.    [provider]  thiamine (VITAMIN B-1) 100 MG tablet Take 100 mg by mouth daily.    [provider]  hyoscyamine (LEVSIN/SL) 0.125 MG SL tablet Place 1 tablet (0.125 mg total) under the tongue every 6 (six) hours as needed for cramping. Patient not  taking: Reported on 01/16/2015 07/23/14 09/27/15  [name], MD    Family History Family History  Problem Relation Age of Onset  . Heart disease Father   . Diabetes type II Father   . Transient ischemic attack Mother   . Colon polyps Son     Social History Social History   Tobacco Use  . Smoking status: Never Smoker  Substance Use Topics  . Alcohol use: Yes    Comment: social  . Drug use: No     Allergies   Scopolamine   Review of Systems Review of Systems  Genitourinary: Positive for dysuria.     Physical Exam Triage Vital Signs ED Triage Vitals  Enc Vitals Group     BP 10/11/20 1030 129/86     Pulse Rate  10/11/20 1030 (!) 103     Resp 10/11/20 1030 18     Temp 10/11/20 1030 (!) 97.5 F (36.4 C)     Temp src --      SpO2 10/11/20 1030 98 %     Weight --      Height --      Head Circumference --      Peak Flow --      Pain Score 10/11/20 1025 4     Pain Loc --      Pain Edu? --      Excl. in [place]? --    No data found.  Updated Vital Signs BP 129/86   Pulse (!) 103   Temp (!) 97.5 F (36.4 C)   Resp 18   LMP 09/20/2015 (Approximate)   SpO2 98%      Physical Exam Vitals and nursing note reviewed.  Constitutional:      General: She is not in acute distress.    Appearance: She is well-developed and well-nourished. She is ill-appearing and diaphoretic.  HENT:     Head: Normocephalic and atraumatic.  Eyes:     Extraocular Movements: Extraocular movements intact.     Conjunctiva/sclera: Conjunctivae normal.     Pupils: Pupils are equal, round, and reactive to light.  Cardiovascular:     Rate and Rhythm: Normal rate and regular rhythm.     Heart sounds: No murmur heard.   Pulmonary:     Effort: Pulmonary effort is normal. No respiratory distress.     Breath sounds: Normal breath sounds.  Abdominal:     Palpations: Abdomen is soft.     Tenderness: There is abdominal tenderness (suprapubic).  Musculoskeletal:        General: No edema. Normal range of motion.     Cervical back: Normal range of motion and neck supple.  Skin:    General: Skin is warm.     Capillary Refill: Capillary refill takes less than 2 seconds.  Neurological:     General: No focal deficit present.     Mental Status: She is alert and oriented to person, place, and time.     Cranial Nerves: No cranial nerve deficit.     Gait: Gait abnormal.  Psychiatric:        Attention and Perception: She is inattentive.        Mood and Affect: Mood and affect normal. Affect is tearful.        Speech: Speech is rapid and pressured.        Behavior: Behavior is agitated.      UC Treatments / Results   Labs (all labs ordered are listed, but only abnormal results are  displayed) Labs Reviewed  URINE CULTURE - Abnormal; Notable for the following components:      Result Value   Culture   (*)    Value: >=100,000 COLONIES/mL ESCHERICHIA COLI SUSCEPTIBILITIES TO FOLLOW Performed at [facility] [address], [place], [place] [address]    All other components within normal limits  CBC WITH DIFFERENTIAL/PLATELET - Abnormal; Notable for the following components:   Lymphocytes Absolute 3.2 (*)    All other components within normal limits   Narrative:    Performed at:  [facility] [address], [place], [place]  [id number] Lab Director: Rush Farmer MD, Phone:  [phone]  COMPREHENSIVE METABOLIC PANEL - Abnormal; Notable for the following components:   Glucose 110 (*)    Albumin 5.0 (*)    All other components within normal limits   Narrative:    Performed at:  [facility] [address], [place], [place]  [id number] Lab Director: Rush Farmer MD, Phone:  [phone]  POCT URINALYSIS DIP (MANUAL ENTRY) - Abnormal; Notable for the following components:   Color, UA other (*)    Clarity, UA cloudy (*)    Spec Grav, UA >=1.030 (*)    Blood, UA large (*)    Protein Ur, POC =100 (*)    Nitrite, UA Positive (*)    Leukocytes, UA Moderate (2+) (*)    All other components within normal limits    EKG   Radiology No results found.  Procedures Procedures (including critical care time)  Medications Ordered in UC Medications  cefTRIAXone (ROCEPHIN) injection 1 g (1 g Intramuscular Given 10/11/20 1051)    Initial Impression / Assessment and Plan / UC Course  I have reviewed the triage vital signs and the nursing notes.  Pertinent labs & imaging results that were available during my care of the patient were reviewed by me and considered in my medical decision making (see chart for details).    Acute Cystitis  Dysuria Tachycardia Stress  Reaction  UA positive for infection I have sent in Cipro for you to take twice a day for 7 days CBC, CMP pending Concerned for sepsis given behavior in the office, had an episode of diaphoresis and confusion (Differential also includes stress reaction) Declines going to the ER at this time If blood work comes back abnormal, needs further evaluation and treatment in the ER Will culture urine and be in touch regarding abnormal results that require further treatment Verbalized understanding and is in agreement with treatment plan  Final Clinical Impressions(s) / UC Diagnoses   Final diagnoses:  Acute cystitis with hematuria  Stress reaction  Dysuria  Tachycardia  Acute confusion     Discharge Instructions     I have sent in Cipro twice a day for 7 days  We have given you an antibiotic injection  Lab work is pending  We will let you know of any abnormal results   If labs show signs of sepsis, she will need to go the ER for further evaluation and treatment  Follow up with this office or with primary care if symptoms are persisting  Follow up in the ER for high fever, trouble swallowing, trouble breathing, other concerning symptoms     ED Prescriptions    Medication Sig Dispense Auth. Provider   ciprofloxacin (CIPRO) 500 MG tablet  (Status: Discontinued) Take 1 tablet (500 mg total) by mouth 2 (two) times daily. 14 tablet Faustino Congress, NP   ciprofloxacin (CIPRO) 500 MG  tablet Take 1 tablet (500 mg total) by mouth 2 (two) times daily. 14 tablet Faustino Congress, NP     PDMP not reviewed this encounter.   Faustino Congress, NP 10/13/20 1051

## 2020-10-27 DIAGNOSIS — G609 Hereditary and idiopathic neuropathy, unspecified: Secondary | ICD-10-CM | POA: Diagnosis not present

## 2020-10-27 DIAGNOSIS — M5416 Radiculopathy, lumbar region: Secondary | ICD-10-CM | POA: Diagnosis not present

## 2020-10-27 DIAGNOSIS — M79606 Pain in leg, unspecified: Secondary | ICD-10-CM | POA: Diagnosis not present

## 2020-10-27 DIAGNOSIS — R2689 Other abnormalities of gait and mobility: Secondary | ICD-10-CM | POA: Diagnosis not present

## 2020-10-27 DIAGNOSIS — M545 Low back pain, unspecified: Secondary | ICD-10-CM | POA: Diagnosis not present

## 2020-10-27 DIAGNOSIS — Z79891 Long term (current) use of opiate analgesic: Secondary | ICD-10-CM | POA: Diagnosis not present

## 2020-10-27 DIAGNOSIS — M79603 Pain in arm, unspecified: Secondary | ICD-10-CM | POA: Diagnosis not present

## 2020-10-27 DIAGNOSIS — F341 Dysthymic disorder: Secondary | ICD-10-CM | POA: Diagnosis not present

## 2020-11-24 DIAGNOSIS — F341 Dysthymic disorder: Secondary | ICD-10-CM | POA: Diagnosis not present

## 2020-11-24 DIAGNOSIS — M5416 Radiculopathy, lumbar region: Secondary | ICD-10-CM | POA: Diagnosis not present

## 2020-11-24 DIAGNOSIS — Z79891 Long term (current) use of opiate analgesic: Secondary | ICD-10-CM | POA: Diagnosis not present

## 2020-11-24 DIAGNOSIS — M79606 Pain in leg, unspecified: Secondary | ICD-10-CM | POA: Diagnosis not present

## 2020-11-24 DIAGNOSIS — M79603 Pain in arm, unspecified: Secondary | ICD-10-CM | POA: Diagnosis not present

## 2020-11-24 DIAGNOSIS — M545 Low back pain, unspecified: Secondary | ICD-10-CM | POA: Diagnosis not present

## 2020-11-24 DIAGNOSIS — G609 Hereditary and idiopathic neuropathy, unspecified: Secondary | ICD-10-CM | POA: Diagnosis not present

## 2020-11-24 DIAGNOSIS — R2689 Other abnormalities of gait and mobility: Secondary | ICD-10-CM | POA: Diagnosis not present

## 2020-12-05 DIAGNOSIS — F331 Major depressive disorder, recurrent, moderate: Secondary | ICD-10-CM | POA: Diagnosis not present

## 2020-12-05 DIAGNOSIS — F438 Other reactions to severe stress: Secondary | ICD-10-CM | POA: Diagnosis not present

## 2020-12-19 DIAGNOSIS — F331 Major depressive disorder, recurrent, moderate: Secondary | ICD-10-CM | POA: Diagnosis not present

## 2020-12-19 DIAGNOSIS — F438 Other reactions to severe stress: Secondary | ICD-10-CM | POA: Diagnosis not present

## 2020-12-26 DIAGNOSIS — F438 Other reactions to severe stress: Secondary | ICD-10-CM | POA: Diagnosis not present

## 2021-01-12 DIAGNOSIS — F438 Other reactions to severe stress: Secondary | ICD-10-CM | POA: Diagnosis not present

## 2021-01-27 DIAGNOSIS — F900 Attention-deficit hyperactivity disorder, predominantly inattentive type: Secondary | ICD-10-CM | POA: Diagnosis not present

## 2021-01-27 DIAGNOSIS — F3189 Other bipolar disorder: Secondary | ICD-10-CM | POA: Diagnosis not present

## 2021-02-23 DIAGNOSIS — G609 Hereditary and idiopathic neuropathy, unspecified: Secondary | ICD-10-CM | POA: Diagnosis not present

## 2021-02-23 DIAGNOSIS — Z79891 Long term (current) use of opiate analgesic: Secondary | ICD-10-CM | POA: Diagnosis not present

## 2021-02-23 DIAGNOSIS — F341 Dysthymic disorder: Secondary | ICD-10-CM | POA: Diagnosis not present

## 2021-02-23 DIAGNOSIS — R2689 Other abnormalities of gait and mobility: Secondary | ICD-10-CM | POA: Diagnosis not present

## 2021-02-23 DIAGNOSIS — G4700 Insomnia, unspecified: Secondary | ICD-10-CM | POA: Diagnosis not present

## 2021-02-23 DIAGNOSIS — M545 Low back pain, unspecified: Secondary | ICD-10-CM | POA: Diagnosis not present

## 2021-02-23 DIAGNOSIS — M79606 Pain in leg, unspecified: Secondary | ICD-10-CM | POA: Diagnosis not present

## 2021-02-23 DIAGNOSIS — M5416 Radiculopathy, lumbar region: Secondary | ICD-10-CM | POA: Diagnosis not present

## 2021-02-23 DIAGNOSIS — M79603 Pain in arm, unspecified: Secondary | ICD-10-CM | POA: Diagnosis not present

## 2021-04-08 ENCOUNTER — Emergency Department (HOSPITAL_COMMUNITY)
Admission: EM | Admit: 2021-04-08 | Discharge: 2021-04-08 | Disposition: A | Discharge status: Home / Self Care | Payer: BC Managed Care – PPO | Attending: Emergency Medicine | Admitting: Emergency Medicine

## 2021-04-08 ENCOUNTER — Emergency Department (HOSPITAL_COMMUNITY): Payer: BC Managed Care – PPO

## 2021-04-08 ENCOUNTER — Encounter (HOSPITAL_COMMUNITY): Payer: Self-pay | Admitting: Emergency Medicine

## 2021-04-08 ENCOUNTER — Other Ambulatory Visit: Payer: Self-pay

## 2021-04-08 ENCOUNTER — Ambulatory Visit
Admission: RE | Admit: 2021-04-08 | Discharge: 2021-04-08 | Disposition: A | Discharge status: Home / Self Care | Payer: BC Managed Care – PPO | Source: Ambulatory Visit | Attending: Family Medicine | Admitting: Family Medicine

## 2021-04-08 VITALS — BP 100/71 | HR 80 | Temp 97.8°F | Resp 20

## 2021-04-08 DIAGNOSIS — K8020 Calculus of gallbladder without cholecystitis without obstruction: Secondary | ICD-10-CM | POA: Diagnosis not present

## 2021-04-08 DIAGNOSIS — R1013 Epigastric pain: Secondary | ICD-10-CM | POA: Diagnosis not present

## 2021-04-08 DIAGNOSIS — Z79899 Other long term (current) drug therapy: Secondary | ICD-10-CM | POA: Insufficient documentation

## 2021-04-08 DIAGNOSIS — E039 Hypothyroidism, unspecified: Secondary | ICD-10-CM | POA: Diagnosis not present

## 2021-04-08 DIAGNOSIS — N83202 Unspecified ovarian cyst, left side: Secondary | ICD-10-CM

## 2021-04-08 DIAGNOSIS — R109 Unspecified abdominal pain: Secondary | ICD-10-CM | POA: Diagnosis not present

## 2021-04-08 LAB — PREGNANCY, URINE: Preg Test, Ur: NEGATIVE

## 2021-04-08 LAB — CBC WITH DIFFERENTIAL/PLATELET
Abs Immature Granulocytes: 0.02 10*3/uL (ref 0.00–0.07)
Basophils Absolute: 0.1 10*3/uL (ref 0.0–0.1)
Basophils Relative: 1 %
Eosinophils Absolute: 0.1 10*3/uL (ref 0.0–0.5)
Eosinophils Relative: 1 %
HCT: 40.6 % (ref 36.0–46.0)
Hemoglobin: 13.6 g/dL (ref 12.0–15.0)
Immature Granulocytes: 0 %
Lymphocytes Relative: 44 %
Lymphs Abs: 3.7 10*3/uL (ref 0.7–4.0)
MCH: 31.9 pg (ref 26.0–34.0)
MCHC: 33.5 g/dL (ref 30.0–36.0)
MCV: 95.1 fL (ref 80.0–100.0)
Monocytes Absolute: 0.6 10*3/uL (ref 0.1–1.0)
Monocytes Relative: 7 %
Neutro Abs: 3.9 10*3/uL (ref 1.7–7.7)
Neutrophils Relative %: 47 %
Platelets: 287 10*3/uL (ref 150–400)
RBC: 4.27 MIL/uL (ref 3.87–5.11)
RDW: 11.9 % (ref 11.5–15.5)
WBC: 8.3 10*3/uL (ref 4.0–10.5)
nRBC: 0 % (ref 0.0–0.2)

## 2021-04-08 LAB — COMPREHENSIVE METABOLIC PANEL
ALT: 19 U/L (ref 0–44)
AST: 17 U/L (ref 15–41)
Albumin: 4.5 g/dL (ref 3.5–5.0)
Alkaline Phosphatase: 54 U/L (ref 38–126)
Anion gap: 7 (ref 5–15)
BUN: 14 mg/dL (ref 6–20)
CO2: 27 mmol/L (ref 22–32)
Calcium: 9.5 mg/dL (ref 8.9–10.3)
Chloride: 103 mmol/L (ref 98–111)
Creatinine, Ser: 0.83 mg/dL (ref 0.44–1.00)
GFR, Estimated: >60 mL/min (ref >60)
Glucose, Bld: 106 mg/dL — ABNORMAL HIGH (ref 70–99)
Potassium: 4.5 mmol/L (ref 3.5–5.1)
Sodium: 137 mmol/L (ref 135–145)
Total Bilirubin: 1.1 mg/dL (ref 0.3–1.2)
Total Protein: 7.5 g/dL (ref 6.5–8.1)

## 2021-04-08 LAB — URINALYSIS, ROUTINE W REFLEX MICROSCOPIC
Bilirubin Urine: NEGATIVE
Glucose, UA: NEGATIVE mg/dL
Hgb urine dipstick: NEGATIVE
Ketones, ur: NEGATIVE mg/dL
Leukocytes,Ua: NEGATIVE
Nitrite: NEGATIVE
Protein, ur: NEGATIVE mg/dL
Specific Gravity, Urine: 1.018 (ref 1.005–1.030)
pH: 6 (ref 5.0–8.0)

## 2021-04-08 LAB — POCT URINALYSIS DIP (MANUAL ENTRY)
Bilirubin, UA: NEGATIVE
Blood, UA: NEGATIVE
Glucose, UA: NEGATIVE mg/dL
Ketones, POC UA: NEGATIVE mg/dL
Leukocytes, UA: NEGATIVE
Nitrite, UA: NEGATIVE
Protein Ur, POC: NEGATIVE mg/dL
Spec Grav, UA: 1.025 (ref 1.010–1.025)
Urobilinogen, UA: 0.2 E.U./dL
pH, UA: 7 (ref 5.0–8.0)

## 2021-04-08 LAB — LIPASE, BLOOD: Lipase: 28 U/L (ref 11–51)

## 2021-04-08 MED ORDER — IOHEXOL 300 MG/ML  SOLN
100.0000 mL | Freq: Once | INTRAMUSCULAR | Status: AC | PRN
  Administered 2021-04-08: 100 mL via INTRAVENOUS

## 2021-04-08 MED ORDER — ONDANSETRON HCL 4 MG/2ML IJ SOLN
4.0000 mg | Freq: Once | INTRAMUSCULAR | Status: AC
  Administered 2021-04-08: 4 mg via INTRAVENOUS
  Filled 2021-04-08: qty 2

## 2021-04-08 MED ORDER — ONDANSETRON HCL 4 MG PO TABS: 4.0000 mg | ORAL_TABLET | Freq: Three times a day (TID) | ORAL | 0 refills | Status: DC | PRN

## 2021-04-08 MED ORDER — FENTANYL CITRATE (PF) 100 MCG/2ML IJ SOLN
50.0000 ug | Freq: Once | INTRAMUSCULAR | Status: AC
  Administered 2021-04-08: 50 ug via INTRAVENOUS
  Filled 2021-04-08: qty 2

## 2021-04-08 MED ORDER — SODIUM CHLORIDE 0.9 % IV BOLUS
1000.0000 mL | Freq: Once | INTRAVENOUS | Status: AC
  Administered 2021-04-08: 1000 mL via INTRAVENOUS

## 2021-04-08 NOTE — ED Triage Notes (Signed)
Pt presents with lower abdominal pain and frequent urination, has taken ol prescription of cipro

## 2021-04-08 NOTE — ED Notes (Signed)
Pt returned from CT °

## 2021-04-08 NOTE — ED Triage Notes (Signed)
Pt c/o upper abd pain x 4-5 days. Pt sent from UC. Some nausea denies vomiting.

## 2021-04-08 NOTE — ED Provider Notes (Signed)
[facility] EMERGENCY DEPARTMENT Provider Note   CSN: [id number] Arrival date & time: 04/08/21  1554     History Chief Complaint  Patient presents with   Abdominal Pain    Amy Wang is a 40 y.o. female.  The history is provided by the patient and medical records. No language interpreter was used.  Abdominal Pain  40 year old female significant history of GERD, IBS, sent here from urgent care center for evaluation of abdominal pain.  Patient report for the past 5 days she has had recurrent abdominal pain.  She described pain as a sharp uncomfortable sensation that starts in her suprapubic region and rise up towards upper abdomen.  Pain has been waxing waning but becoming more intense.  She endorsed mild nausea without vomiting.  No fever chills chest pain shortness of breath productive cough dysuria hematuria vaginal bleeding or vaginal discharge.  She denies any postprandial pain.  She went to see urgent care center today for her complaint.  States that her urine was normal however she was recommended to come to the ER for further evaluation.  She mention having sepsis from urinary tract infection in the past and she did try to take some leftover antibiotic for the past few days thinking that it could be a urinary tract infection but noticed no improvement.  Patient denies any postprandial pain.  She has had a prior hysterectomy.  Past Medical History:  Diagnosis Date   Anxiety    Arthritis    back   Bulging lumbar disc 01/2013   GERD (gastroesophageal reflux disease)    IBS (irritable bowel syndrome)     Patient Active Problem List   Diagnosis Date Noted   Hypothyroidism 10/24/2015   Thyroid cyst 10/24/2015    Past Surgical History:  Procedure Laterality Date   BREAST SURGERY     BUNIONECTOMY Right    COLONOSCOPY N/A 11/22/2013   Procedure: COLONOSCOPY;  Surgeon: Rogene Houston, MD;  Location: [facility];  Service: Endoscopy;  Laterality: N/A;  [place]     as child   tummy tuck  2010     OB History   No obstetric history on file.     Family History  Problem Relation Age of Onset   Heart disease Father    Diabetes type II Father    Transient ischemic attack Mother    Colon polyps Son     Social History   Tobacco Use   Smoking status: Never  Substance Use Topics   Alcohol use: Yes    Comment: social   Drug use: No    Home Medications Prior to Admission medications   Medication Sig Start Date End Date Taking? Authorizing Provider  ALPRAZolam Duanne Moron) 1 MG tablet Take 1 mg by mouth 3 (three) times daily as needed for anxiety.     [provider]  Carbamazepine (EQUETRO) 100 MG CP12 12 hr capsule Take by mouth.    [provider]  ciprofloxacin (CIPRO) 500 MG tablet Take 1 tablet (500 mg total) by mouth 2 (two) times daily. 10/11/20   Faustino Congress, NP  DULoxetine (CYMBALTA) 30 MG capsule Take 90 mg by mouth every morning.    [provider]  FLUoxetine (PROZAC) 20 MG capsule TAKE 3 CAPSULES BY MOUTH ONCE DAILY. 08/04/20 08/04/21  Barton Fanny, NP  levothyroxine (SYNTHROID, LEVOTHROID) 75 MCG tablet Take 1 tablet (75 mcg total) by mouth daily. 10/24/15   Gherghe,  Salena Saner, MD  lurasidone (LATUDA) 20 MG TABS tablet Take by mouth.    [provider]  metoprolol succinate (TOPROL-XL) 25 MG 24 hr tablet Take 1 tablet (25 mg total) by mouth daily. 09/27/15   Melony Overly, MD  Multiple Vitamin (MULTIVITAMIN WITH MINERALS) TABS tablet Take 1 tablet by mouth daily.    [provider]  oxyCODONE-acetaminophen (PERCOCET) 7.5-325 MG tablet take 1 tablet by mouth every 6 hours if needed for severe pain 10/16/15   [provider]  pregabalin (LYRICA) 200 MG capsule TAKE 1 CAPSULE BY MOUTH THREE TIMES DAILY. 08/04/20 01/31/21  Barton Fanny, NP  Probiotic Product (ALIGN PO) Take 1 capsule by mouth daily.    [provider]  thiamine (VITAMIN B-1)  100 MG tablet Take 100 mg by mouth daily.    [provider]  hyoscyamine (LEVSIN/SL) 0.125 MG SL tablet Place 1 tablet (0.125 mg total) under the tongue every 6 (six) hours as needed for cramping. Patient not taking: Reported on 01/16/2015 07/23/14 09/27/15  [name], MD    Allergies    Scopolamine  Review of Systems   Review of Systems  Gastrointestinal:  Positive for abdominal pain.  All other systems reviewed and are negative.  Physical Exam Updated Vital Signs BP 93/64   Pulse 82   Temp 98.2 F (36.8 C)   Resp 18   Ht 5\' 10"  (1.778 m)   Wt 108.9 kg   LMP 09/20/2015 (Approximate)   SpO2 100%   BMI 34.44 kg/m   Physical Exam Vitals and nursing note reviewed.  Constitutional:      General: She is not in acute distress.    Appearance: She is well-developed. She is obese.  HENT:     Head: Atraumatic.  Eyes:     Conjunctiva/sclera: Conjunctivae normal.  Cardiovascular:     Rate and Rhythm: Normal rate and regular rhythm.  Pulmonary:     Effort: Pulmonary effort is normal.  Abdominal:     General: Abdomen is flat.     Palpations: Abdomen is soft.     Tenderness: There is abdominal tenderness (Tenderness to suprapubic region on palpation mild right upper quadrant tenderness and epigastric tenderness.  No guarding or no rebound tenderness.).  Musculoskeletal:     Cervical back: Neck supple.  Skin:    Findings: No rash.  Neurological:     Mental Status: She is alert.  Psychiatric:        Mood and Affect: Mood normal.    ED Results / Procedures / Treatments   Labs (all labs ordered are listed, but only abnormal results are displayed) Labs Reviewed  COMPREHENSIVE METABOLIC PANEL - Abnormal; Notable for the following components:      Result Value   Glucose, Bld 106 (*)    All other components within normal limits  CBC WITH DIFFERENTIAL/PLATELET  LIPASE, BLOOD  PREGNANCY, URINE  URINALYSIS, ROUTINE W REFLEX MICROSCOPIC     EKG None  Radiology CT ABDOMEN PELVIS W CONTRAST  Result Date: 04/08/2021 CLINICAL DATA:  Nonlocalized acute abdominal pain. EXAM: CT ABDOMEN AND PELVIS WITH CONTRAST TECHNIQUE: Multidetector CT imaging of the abdomen and pelvis was performed using the standard protocol following bolus administration of intravenous contrast. CONTRAST:  164mL OMNIPAQUE IOHEXOL 300 MG/ML  SOLN COMPARISON:  CT abdomen pelvis 07/23/2014 FINDINGS: Lower chest: No acute abnormality. Hepatobiliary: No focal liver abnormality. No gallstones, gallbladder wall thickening, or pericholecystic fluid. No biliary dilatation. Pancreas: No focal lesion. Normal pancreatic contour. No surrounding  inflammatory changes. No main pancreatic ductal dilatation. Spleen: Normal in size without focal abnormality. Adrenals/Urinary Tract: No adrenal nodule bilaterally. Bilateral kidneys enhance symmetrically. No hydronephrosis. No hydroureter. The urinary bladder is mildly distended with urine. Circumferential smooth urinary bladder wall thickening likely due to under distension. Stomach/Bowel: Stomach is within normal limits. No evidence of bowel wall thickening or dilatation. Appendix appears normal. Vascular/Lymphatic: No abdominal aorta or iliac aneurysm. No abdominal, pelvic, or inguinal lymphadenopathy. Reproductive: Status post hysterectomy. Interval development of a 3.4 x 2.5 cm fluid density lesion within the left adnexa. Other: No intraperitoneal free fluid. No intraperitoneal free gas. No organized fluid collection. Musculoskeletal: No abdominal wall hernia or abnormality. No suspicious lytic or blastic osseous lesions. No acute displaced fracture. Multilevel degenerative changes of the spine. IMPRESSION: 1. Interval development of a 3.4 cm simple left adnexal cystic lesion. If patient premenopausal, no further evaluation indicated. Patient postmenopausal, Recommend follow-up US in 6-12 months. Baseball these guidelines: Note: This  recommendation does not apply to premenarchal patients and to those with increased risk (genetic, family history, elevated tumor markers or other high-risk factors) of ovarian cancer. Reference: JACR 2020 Feb; 17(2):248-254. 2. Otherwise no acute intra-abdominal or intrapelvic abnormality. Electronically Signed   By: Iven Finn M.D.   On: 04/08/2021 21:03   US Abdomen Limited  Result Date: 04/08/2021 CLINICAL DATA:  Right upper and lower abdominal pain with frequent urination. EXAM: ULTRASOUND ABDOMEN LIMITED RIGHT UPPER QUADRANT COMPARISON:  Abdominal CT 07/23/2014. FINDINGS: Gallbladder: Multiple movable gallstones. No evidence of gallbladder wall thickening or pericholecystic fluid. The sonographer reports the presence of a sonographic Murphy sign. Common bile duct: Diameter: 4 mm Liver: No focal lesion identified. The hepatic echogenicity is mildly increased. Portal vein is patent on color Doppler imaging with normal direction of blood flow towards the liver. Other: None. IMPRESSION: 1. Cholelithiasis without objective signs of cholecystitis. The sonographer does report a sonographic Murphy sign. Hepatobiliary scan may be helpful for further evaluation of cholecystitis if clinically warranted. 2. No biliary dilatation. 3. Echogenic liver, likely due to steatosis. Electronically Signed   By: Richardean Sale M.D.   On: 04/08/2021 18:17    Procedures Procedures   Medications Ordered in ED Medications  fentaNYL (SUBLIMAZE) injection 50 mcg (50 mcg Intravenous Given 04/08/21 2029)  ondansetron (ZOFRAN) injection 4 mg (4 mg Intravenous Given 04/08/21 2029)  sodium chloride 0.9 % bolus 1,000 mL (1,000 mLs Intravenous New Bag/Given 04/08/21 2028)  iohexol (OMNIPAQUE) 300 MG/ML solution 100 mL (100 mLs Intravenous Contrast Given 04/08/21 2054)    ED Course  I have reviewed the triage vital signs and the nursing notes.  Pertinent labs & imaging results that were available during my care of the patient were  reviewed by me and considered in my medical decision making (see chart for details).    MDM Rules/Calculators/A&P                          BP 93/64   Pulse 82   Temp 98.2 F (36.8 C)   Resp 18   Ht 5\' 10"  (1.778 m)   Wt 108.9 kg   LMP 09/20/2015 (Approximate)   SpO2 100%   BMI 34.44 kg/m   Final Clinical Impression(s) / ED Diagnoses Final diagnoses:  Cyst of left ovary    Rx / DC Orders ED Discharge Orders          Ordered    ondansetron (ZOFRAN) 4 MG tablet  Every 8  hours PRN        04/08/21 2205           6:50 PM Patient complaining of lower abdominal pain that radiates towards her upper abdomen for the past several days.  Was seen in urgent care center and reportedly had a UA that was negative for UTI however due to her pain she was recommended to come to ER for evaluation.  A limited abdominal ultrasound obtained today that showed evidence of cholelithiasis without objective signs of cholecystitis.  However radiologist may recommend abdominal biliary scan for further evaluation of cholecystitis if clinically warranted.  On exam her pain is more localized to her lower abdomen as opposed to her upper abdomen.  She has had a hysterectomy therefore I will have low suspicion for PID.  She denies any new sexual partner.  She still has intact appendix.  May consider obtaining an abdominal pelvis CT scan for further evaluation.  10:01 PM Pregnancy test is negative, urine without signs of urinary tract infection, normal white count, normal H&H, electrolyte panels are reassuring, normal lipase.  An abdominal pelvis CT scan obtained today demonstrate an interval development of a 3.4 cm simple left adnexal cyst lesion.  If the patient is premenopausal no further evaluation is indicated.  If patient is postmenopausal, recommend follow-up with ultrasound in 6 to 12 months.  Patient has had hysterectomy 6 years ago.  Recommend patient to discuss this finding with her primary care doctor  for further surveillance.  CT scan did not show any evidence of gallbladder etiology no signs of gallstones or signs of cholecystitis.  At this time, patient is stable for discharge, recommend outpatient follow-up and return precaution given.   Domenic Moras, PA-C 04/08/21 2206    Noemi Chapel, MD 04/09/21 [phone]

## 2021-04-08 NOTE — Discharge Instructions (Addendum)
You have been evaluated for your abdominal pain.  Initially an ultrasound of the gallbladder may have shown some abnormalities however CT scan obtained today did not show any evidence of gallstone or signs of inflammation of your gallbladder.  There is however a 3.4 cm cyst to your left ovary region.  I recommend call and follow-up closely with your primary care doctor for further evaluation and management.  You may benefit from surveillance ultrasound every 6 to 12 months to ensure no increase growth.  You may take over-the-counter Tylenol or ibuprofen as needed for pain.  Take Zofran as needed for nausea.

## 2021-04-09 NOTE — ED Provider Notes (Signed)
[place]   [id number] 04/08/21 Arrival Time: 9093  ASSESSMENT & PLAN:  1. Abdominal pain, epigastric    In considerable pain here. U/A negative. S/P hysterectomy. Recommend ED evaluation for higher level of care and for abd/pelvic imaging. Cannot r/o cholelithiasis or early appendicitis. She agrees. To ED via private vehicle.   Follow-up Information     Go to  [facility].   Specialty: Emergency Medicine Contact information: [address] [id number] mc [place] [phone]                Reviewed expectations re: course of current medical issues. Questions answered. Outlined signs and symptoms indicating need for more acute intervention. Patient verbalized understanding. After Visit Summary given.   SUBJECTIVE: History from: patient. Amy Wang is a 40 y.o. female who presents with complaint of fairly persistent RUQ abdominal pain. Onset gradual,  sev d ago . Discomfort described as aching; "kind of feel it in my chest"; right. Occas wakes her at night. Reports normal flatus. Symptoms are gradually worsening since beginning. Fever: absent. Aggravating factors: have not been identified. Alleviating factors: have not been identified. Associated symptoms: nausea without vomiting. She denies chills, dysuria, fever, headache, and myalgias. Appetite: decreased. PO intake: decreased. Ambulatory without assistance. Urinary symptoms: none. Bowel movements: normal History of similar: no. OTC treatment: none PTA.  Patient's last menstrual period was 09/20/2015 (approximate).  Past Surgical History:  Procedure Laterality Date   BREAST SURGERY     BUNIONECTOMY Right    COLONOSCOPY N/A 11/22/2013   Procedure: COLONOSCOPY;  Surgeon: Rogene Houston, MD;  Location: [facility];  Service: Endoscopy;  Laterality: N/A;  [place]     as child   tummy tuck  2010      OBJECTIVE:  Vitals:   04/08/21 1525  BP: 100/71  Pulse: 80  Resp: 20  Temp: 97.8 F (36.6 C)  SpO2: 96%    General appearance: alert, oriented, no acute distress but appears to be in pain; holding R side on exam table HEENT: [place]; AT; oropharynx moist Lungs: unlabored respirations Abdomen: soft; without distention; moderate and poorly localized tenderness to palpation over RUQ ; normal bowel sounds; without masses or organomegaly; without guarding or rebound tenderness Back: without reported CVA tenderness; FROM at waist Extremities: without LE edema; symmetrical; without gross deformities Skin: warm and dry Neurologic: normal gait Psychological: alert and cooperative; normal mood and affect  Labs: Results for orders placed or performed during the hospital encounter of 04/08/21  POCT urinalysis dipstick  Result Value Ref Range   Color, UA yellow yellow   Clarity, UA clear clear   Glucose, UA negative negative mg/dL   Bilirubin, UA negative negative   Ketones, POC UA negative negative mg/dL   Spec Grav, UA 1.025 1.010 - 1.025   Blood, UA negative negative   pH, UA 7.0 5.0 - 8.0   Protein Ur, POC negative negative mg/dL   Urobilinogen, UA 0.2 0.2 or 1.0 E.U./dL   Nitrite, UA Negative Negative   Leukocytes, UA Negative Negative   Labs Reviewed  POCT URINALYSIS DIP (MANUAL ENTRY)   Allergies  Allergen Reactions   Scopolamine Nausea And Vomiting    TRAMSDERM  Past Medical History:  Diagnosis Date   Anxiety    Arthritis    back   Bulging lumbar disc 01/2013   GERD (gastroesophageal reflux disease)    IBS (irritable bowel syndrome)     Social History   Socioeconomic History   Marital status: Married    Spouse name: Not on file   Number of children: Not on file   Years of education: Not on file   Highest education level: Not on file  Occupational History   Not on file  Tobacco Use   Smoking status: Never    Smokeless tobacco: Not on file  Substance and Sexual Activity   Alcohol use: Yes    Comment: social   Drug use: No   Sexual activity: Yes    Birth control/protection: None  Other Topics Concern   Not on file  Social History Narrative   Not on file   Social Determinants of Health   Financial Resource Strain: Not on file  Food Insecurity: Not on file  Transportation Needs: Not on file  Physical Activity: Not on file  Stress: Not on file  Social Connections: Not on file  Intimate Partner Violence: Not on file    Family History  Problem Relation Age of Onset   Heart disease Father    Diabetes type II Father    Transient ischemic attack Mother    Colon polyps Son      Vanessa Kick, MD 04/09/21 [phone]

## 2021-04-13 ENCOUNTER — Other Ambulatory Visit (INDEPENDENT_AMBULATORY_CARE_PROVIDER_SITE_OTHER): Payer: Self-pay

## 2021-04-16 DIAGNOSIS — Z1231 Encounter for screening mammogram for malignant neoplasm of breast: Secondary | ICD-10-CM | POA: Diagnosis not present

## 2021-05-06 DIAGNOSIS — R922 Inconclusive mammogram: Secondary | ICD-10-CM | POA: Diagnosis not present

## 2021-05-06 DIAGNOSIS — R928 Other abnormal and inconclusive findings on diagnostic imaging of breast: Secondary | ICD-10-CM | POA: Diagnosis not present

## 2021-05-06 DIAGNOSIS — Z9882 Breast implant status: Secondary | ICD-10-CM | POA: Diagnosis not present

## 2021-05-18 DIAGNOSIS — F341 Dysthymic disorder: Secondary | ICD-10-CM | POA: Diagnosis not present

## 2021-05-18 DIAGNOSIS — M5416 Radiculopathy, lumbar region: Secondary | ICD-10-CM | POA: Diagnosis not present

## 2021-05-18 DIAGNOSIS — G4700 Insomnia, unspecified: Secondary | ICD-10-CM | POA: Diagnosis not present

## 2021-05-18 DIAGNOSIS — M79606 Pain in leg, unspecified: Secondary | ICD-10-CM | POA: Diagnosis not present

## 2021-05-18 DIAGNOSIS — M545 Low back pain, unspecified: Secondary | ICD-10-CM | POA: Diagnosis not present

## 2021-05-18 DIAGNOSIS — G609 Hereditary and idiopathic neuropathy, unspecified: Secondary | ICD-10-CM | POA: Diagnosis not present

## 2021-05-18 DIAGNOSIS — R2689 Other abnormalities of gait and mobility: Secondary | ICD-10-CM | POA: Diagnosis not present

## 2021-05-18 DIAGNOSIS — M79603 Pain in arm, unspecified: Secondary | ICD-10-CM | POA: Diagnosis not present

## 2021-05-18 DIAGNOSIS — Z79891 Long term (current) use of opiate analgesic: Secondary | ICD-10-CM | POA: Diagnosis not present

## 2021-06-19 ENCOUNTER — Inpatient Hospital Stay (HOSPITAL_COMMUNITY)
Admission: EM | Admit: 2021-06-19 | Discharge: 2021-06-23 | DRG: 419 | Disposition: A | Discharge status: Home / Self Care | Payer: BC Managed Care – PPO | Attending: General Surgery | Admitting: General Surgery

## 2021-06-19 ENCOUNTER — Encounter (HOSPITAL_COMMUNITY): Payer: Self-pay

## 2021-06-19 ENCOUNTER — Other Ambulatory Visit: Payer: Self-pay

## 2021-06-19 ENCOUNTER — Emergency Department (HOSPITAL_COMMUNITY): Payer: BC Managed Care – PPO

## 2021-06-19 DIAGNOSIS — Z888 Allergy status to other drugs, medicaments and biological substances status: Secondary | ICD-10-CM

## 2021-06-19 DIAGNOSIS — N83202 Unspecified ovarian cyst, left side: Secondary | ICD-10-CM | POA: Diagnosis present

## 2021-06-19 DIAGNOSIS — K589 Irritable bowel syndrome without diarrhea: Secondary | ICD-10-CM | POA: Diagnosis present

## 2021-06-19 DIAGNOSIS — R110 Nausea: Secondary | ICD-10-CM | POA: Diagnosis not present

## 2021-06-19 DIAGNOSIS — K8010 Calculus of gallbladder with chronic cholecystitis without obstruction: Secondary | ICD-10-CM | POA: Diagnosis not present

## 2021-06-19 DIAGNOSIS — Z20822 Contact with and (suspected) exposure to covid-19: Secondary | ICD-10-CM | POA: Diagnosis present

## 2021-06-19 DIAGNOSIS — K219 Gastro-esophageal reflux disease without esophagitis: Secondary | ICD-10-CM | POA: Diagnosis present

## 2021-06-19 DIAGNOSIS — G8929 Other chronic pain: Secondary | ICD-10-CM | POA: Diagnosis not present

## 2021-06-19 DIAGNOSIS — F32A Depression, unspecified: Secondary | ICD-10-CM | POA: Diagnosis present

## 2021-06-19 DIAGNOSIS — E039 Hypothyroidism, unspecified: Secondary | ICD-10-CM | POA: Diagnosis present

## 2021-06-19 DIAGNOSIS — Z6835 Body mass index (BMI) 35.0-35.9, adult: Secondary | ICD-10-CM

## 2021-06-19 DIAGNOSIS — Z79899 Other long term (current) drug therapy: Secondary | ICD-10-CM | POA: Diagnosis not present

## 2021-06-19 DIAGNOSIS — K8020 Calculus of gallbladder without cholecystitis without obstruction: Secondary | ICD-10-CM | POA: Diagnosis not present

## 2021-06-19 DIAGNOSIS — K819 Cholecystitis, unspecified: Secondary | ICD-10-CM

## 2021-06-19 DIAGNOSIS — K810 Acute cholecystitis: Principal | ICD-10-CM | POA: Diagnosis present

## 2021-06-19 DIAGNOSIS — Z79890 Hormone replacement therapy (postmenopausal): Secondary | ICD-10-CM

## 2021-06-19 DIAGNOSIS — R1011 Right upper quadrant pain: Secondary | ICD-10-CM | POA: Diagnosis not present

## 2021-06-19 DIAGNOSIS — R109 Unspecified abdominal pain: Secondary | ICD-10-CM | POA: Diagnosis not present

## 2021-06-19 DIAGNOSIS — M1990 Unspecified osteoarthritis, unspecified site: Secondary | ICD-10-CM | POA: Diagnosis present

## 2021-06-19 DIAGNOSIS — G609 Hereditary and idiopathic neuropathy, unspecified: Secondary | ICD-10-CM | POA: Diagnosis not present

## 2021-06-19 HISTORY — DX: Disorder of thyroid, unspecified: E07.9

## 2021-06-19 HISTORY — DX: Polyneuropathy, unspecified: G62.9

## 2021-06-19 LAB — URINALYSIS, ROUTINE W REFLEX MICROSCOPIC
Bilirubin Urine: NEGATIVE
Glucose, UA: NEGATIVE mg/dL
Hgb urine dipstick: NEGATIVE
Ketones, ur: NEGATIVE mg/dL
Leukocytes,Ua: NEGATIVE
Nitrite: NEGATIVE
Protein, ur: NEGATIVE mg/dL
Specific Gravity, Urine: 1.004 — ABNORMAL LOW (ref 1.005–1.030)
pH: 8 (ref 5.0–8.0)

## 2021-06-19 LAB — COMPREHENSIVE METABOLIC PANEL
ALT: 24 U/L (ref 0–44)
AST: 27 U/L (ref 15–41)
Albumin: 3.8 g/dL (ref 3.5–5.0)
Alkaline Phosphatase: 74 U/L (ref 38–126)
Anion gap: 7 (ref 5–15)
BUN: 11 mg/dL (ref 6–20)
CO2: 25 mmol/L (ref 22–32)
Calcium: 8.9 mg/dL (ref 8.9–10.3)
Chloride: 106 mmol/L (ref 98–111)
Creatinine, Ser: 0.53 mg/dL (ref 0.44–1.00)
GFR, Estimated: >60 mL/min (ref >60)
Glucose, Bld: 105 mg/dL — ABNORMAL HIGH (ref 70–99)
Potassium: 4.2 mmol/L (ref 3.5–5.1)
Sodium: 138 mmol/L (ref 135–145)
Total Bilirubin: 0.3 mg/dL (ref 0.3–1.2)
Total Protein: 6.7 g/dL (ref 6.5–8.1)

## 2021-06-19 LAB — CBC
HCT: 36.5 % (ref 36.0–46.0)
Hemoglobin: 12.9 g/dL (ref 12.0–15.0)
MCH: 32.3 pg (ref 26.0–34.0)
MCHC: 35.3 g/dL (ref 30.0–36.0)
MCV: 91.5 fL (ref 80.0–100.0)
Platelets: 217 10*3/uL (ref 150–400)
RBC: 3.99 MIL/uL (ref 3.87–5.11)
RDW: 12.1 % (ref 11.5–15.5)
WBC: 9.6 10*3/uL (ref 4.0–10.5)
nRBC: 0 % (ref 0.0–0.2)

## 2021-06-19 LAB — LIPASE, BLOOD: Lipase: 32 U/L (ref 11–51)

## 2021-06-19 LAB — PREGNANCY, URINE: Preg Test, Ur: NEGATIVE

## 2021-06-19 MED ORDER — FENTANYL CITRATE PF 50 MCG/ML IJ SOSY
50.0000 ug | PREFILLED_SYRINGE | Freq: Once | INTRAMUSCULAR | Status: AC
  Administered 2021-06-19: 50 ug via INTRAVENOUS
  Filled 2021-06-19: qty 1

## 2021-06-19 MED ORDER — METRONIDAZOLE 500 MG/100ML IV SOLN: 500.0000 mg | Freq: Two times a day (BID) | INTRAVENOUS | Status: DC

## 2021-06-19 MED ORDER — MORPHINE SULFATE ER 30 MG PO TBCR
30.0000 mg | EXTENDED_RELEASE_TABLET | Freq: Two times a day (BID) | ORAL | Status: DC
  Administered 2021-06-20: 30 mg via ORAL
  Filled 2021-06-19 (×2): qty 1

## 2021-06-19 MED ORDER — LURASIDONE HCL 40 MG PO TABS
40.0000 mg | ORAL_TABLET | Freq: Every day | ORAL | Status: DC
  Administered 2021-06-20 – 2021-06-23 (×3): 40 mg via ORAL
  Filled 2021-06-19 (×4): qty 1

## 2021-06-19 MED ORDER — METOPROLOL TARTRATE 50 MG PO TABS
50.0000 mg | ORAL_TABLET | Freq: Every day | ORAL | Status: DC
  Administered 2021-06-20 – 2021-06-21 (×2): 50 mg via ORAL
  Filled 2021-06-19 (×4): qty 1

## 2021-06-19 MED ORDER — KETOROLAC TROMETHAMINE 30 MG/ML IJ SOLN: 30.0000 mg | Freq: Four times a day (QID) | INTRAMUSCULAR | Status: DC | PRN

## 2021-06-19 MED ORDER — SODIUM CHLORIDE 0.9 % IV SOLN
2.0000 g | INTRAVENOUS | Status: DC
  Administered 2021-06-19 – 2021-06-22 (×4): 2 g via INTRAVENOUS
  Filled 2021-06-19 (×4): qty 20

## 2021-06-19 MED ORDER — ONDANSETRON HCL 4 MG PO TABS: 4.0000 mg | ORAL_TABLET | Freq: Four times a day (QID) | ORAL | Status: DC | PRN

## 2021-06-19 MED ORDER — PREGABALIN 75 MG PO CAPS
200.0000 mg | ORAL_CAPSULE | Freq: Three times a day (TID) | ORAL | Status: DC
  Administered 2021-06-19 – 2021-06-23 (×10): 200 mg via ORAL
  Filled 2021-06-19 (×10): qty 1

## 2021-06-19 MED ORDER — HYDROMORPHONE HCL 1 MG/ML IJ SOLN
0.5000 mg | INTRAMUSCULAR | Status: DC | PRN
  Administered 2021-06-19: 0.5 mg via INTRAVENOUS
  Filled 2021-06-19: qty 0.5

## 2021-06-19 MED ORDER — OXYCODONE-ACETAMINOPHEN 7.5-325 MG PO TABS
1.0000 | ORAL_TABLET | Freq: Four times a day (QID) | ORAL | Status: DC | PRN
  Administered 2021-06-20: 1 via ORAL
  Filled 2021-06-19: qty 1

## 2021-06-19 MED ORDER — HYDROMORPHONE HCL 1 MG/ML IJ SOLN: 0.5000 mg | INTRAMUSCULAR | Status: DC | PRN

## 2021-06-19 MED ORDER — SODIUM CHLORIDE 0.9 % IV BOLUS
1000.0000 mL | Freq: Once | INTRAVENOUS | Status: AC
  Administered 2021-06-19: 1000 mL via INTRAVENOUS

## 2021-06-19 MED ORDER — LEVOTHYROXINE SODIUM 75 MCG PO TABS
75.0000 ug | ORAL_TABLET | Freq: Every day | ORAL | Status: DC
  Administered 2021-06-20 – 2021-06-23 (×4): 75 ug via ORAL
  Filled 2021-06-19 (×4): qty 1

## 2021-06-19 MED ORDER — ONDANSETRON 4 MG PO TBDP
4.0000 mg | ORAL_TABLET | Freq: Once | ORAL | Status: AC | PRN
  Administered 2021-06-19: 4 mg via ORAL
  Filled 2021-06-19: qty 1

## 2021-06-19 MED ORDER — KETOROLAC TROMETHAMINE 30 MG/ML IJ SOLN
30.0000 mg | Freq: Four times a day (QID) | INTRAMUSCULAR | Status: DC | PRN
  Administered 2021-06-22 (×2): 30 mg via INTRAVENOUS
  Filled 2021-06-19 (×2): qty 1

## 2021-06-19 MED ORDER — HYDROMORPHONE HCL 1 MG/ML IJ SOLN
1.0000 mg | Freq: Once | INTRAMUSCULAR | Status: AC
  Administered 2021-06-19: 1 mg via INTRAVENOUS
  Filled 2021-06-19: qty 1

## 2021-06-19 MED ORDER — ONDANSETRON HCL 4 MG/2ML IJ SOLN: 4.0000 mg | Freq: Four times a day (QID) | INTRAMUSCULAR | Status: DC | PRN

## 2021-06-19 MED ORDER — IOHEXOL 300 MG/ML  SOLN
100.0000 mL | Freq: Once | INTRAMUSCULAR | Status: AC | PRN
  Administered 2021-06-19: 100 mL via INTRAVENOUS

## 2021-06-19 MED ORDER — METRONIDAZOLE 500 MG PO TABS
500.0000 mg | ORAL_TABLET | Freq: Two times a day (BID) | ORAL | Status: DC
  Administered 2021-06-19 – 2021-06-23 (×8): 500 mg via ORAL
  Filled 2021-06-19 (×7): qty 1

## 2021-06-19 MED ORDER — CLONAZEPAM 0.5 MG PO TABS
1.0000 mg | ORAL_TABLET | Freq: Three times a day (TID) | ORAL | Status: DC
  Administered 2021-06-19 – 2021-06-23 (×9): 1 mg via ORAL
  Filled 2021-06-19 (×9): qty 2

## 2021-06-19 NOTE — ED Notes (Signed)
Blood culture collection in progress

## 2021-06-19 NOTE — ED Triage Notes (Signed)
Signature pad in room not working. Pt provided verbal understanding of MSE form.

## 2021-06-19 NOTE — ED Notes (Signed)
Pt reports pain beginning to return, awaiting provider with results

## 2021-06-19 NOTE — ED Notes (Signed)
Report attempted x 1

## 2021-06-19 NOTE — ED Notes (Signed)
Pt states pain improved since arrival.  Awaiting ED provider

## 2021-06-19 NOTE — ED Provider Notes (Signed)
[facility] EMERGENCY DEPARTMENT Provider Note   CSN: [id number] Arrival date & time: 06/19/21  1031     History Chief Complaint  Patient presents with   Abdominal Pain    Amy Wang is a 40 y.o. female who presents with abdominal pain for 1 month.  Her pain is constant, no aggravating or alleviating factors.  Patient was initially seen at the pain of July diagnosed with a left sided ovarian cyst and numerous gallstones, without evidence of cholecystitis.  She was instructed to follow-up with her primary care doctor, has not done this.  She states that her pain is worsening and she now has persistent nausea and vomiting.  Takes morphine for neuropathy, this has not been improving her pain.  Denies fever, chills, chest pain, shortness of breath.   Abdominal Pain Associated symptoms: nausea and vomiting   Associated symptoms: no chest pain, no chills, no constipation, no diarrhea, no dysuria, no fever and no shortness of breath       Past Medical History:  Diagnosis Date   Anxiety    Arthritis    back   Bulging lumbar disc 01/02/2013   GERD (gastroesophageal reflux disease)    IBS (irritable bowel syndrome)    Neuropathy    Thyroid disease     Patient Active Problem List   Diagnosis Date Noted   Hypothyroidism 10/24/2015   Thyroid cyst 10/24/2015    Past Surgical History:  Procedure Laterality Date   BREAST SURGERY     BUNIONECTOMY Right    COLONOSCOPY N/A 11/22/2013   Procedure: COLONOSCOPY;  Surgeon: Rogene Houston, MD;  Location: [facility];  Service: Endoscopy;  Laterality: N/A;  [place]     as child   tummy tuck  2010     OB History   No obstetric history on file.     Family History  Problem Relation Age of Onset   Heart disease Father    Diabetes type II Father    Transient ischemic attack Mother    Colon polyps Son     Social History   Tobacco Use   Smoking status: Never   Smokeless tobacco: Never   Vaping Use   Vaping Use: Never used  Substance Use Topics   Alcohol use: Not Currently    Comment: social   Drug use: No    Home Medications Prior to Admission medications   Medication Sig Start Date End Date Taking? Authorizing Provider  clonazePAM (KLONOPIN) 1 MG tablet Take 1 mg by mouth 3 (three) times daily. 06/15/21  Yes [provider]  LATUDA 40 MG TABS tablet Take 1 tablet by mouth daily. 06/05/21  Yes [provider]  levothyroxine (SYNTHROID, LEVOTHROID) 75 MCG tablet Take 1 tablet (75 mcg total) by mouth daily. 10/24/15  Yes Philemon Kingdom, MD  metoprolol tartrate (LOPRESSOR) 50 MG tablet Take 50 mg by mouth daily. 05/01/21  Yes [provider]  morphine (MS CONTIN) 30 MG 12 hr tablet Take 30 mg by mouth every 12 (twelve) hours.   Yes [provider]  pregabalin (LYRICA) 200 MG capsule TAKE 1 CAPSULE BY MOUTH THREE TIMES DAILY. 08/04/20 06/19/21 Yes Barton Fanny, NP  ALPRAZolam Duanne Moron) 1 MG tablet Take 1 mg by mouth 3 (three) times daily as needed for anxiety.  Patient not taking: No sig reported    [provider]  amphetamine-dextroamphetamine (ADDERALL) 30 MG tablet Take 30 mg by mouth daily. Patient  not taking: Reported on 06/19/2021    [provider]  Carbamazepine (EQUETRO) 100 MG CP12 12 hr capsule Take by mouth. Patient not taking: No sig reported    [provider]  ciprofloxacin (CIPRO) 500 MG tablet Take 1 tablet (500 mg total) by mouth 2 (two) times daily. Patient not taking: No sig reported 10/11/20   Faustino Congress, NP  DULoxetine (CYMBALTA) 30 MG capsule Take 90 mg by mouth every morning. Patient not taking: No sig reported    [provider]  fenofibrate micronized (LOFIBRA) 67 MG capsule Take 67 mg by mouth daily. Patient not taking: Reported on 06/19/2021 12/16/20   [provider]  FLUoxetine (PROZAC) 20 MG capsule TAKE 3 CAPSULES BY MOUTH ONCE DAILY. Patient not taking: No  sig reported 08/04/20 08/04/21  Barton Fanny, NP  ondansetron (ZOFRAN) 4 MG tablet Take 1 tablet (4 mg total) by mouth every 8 (eight) hours as needed for nausea or vomiting. Patient not taking: Reported on 06/19/2021 04/08/21   Domenic Moras, PA-C  oxyCODONE-acetaminophen (PERCOCET) 7.5-325 MG tablet Take 1 tablet by mouth every 6 (six) hours as needed for moderate pain. Patient not taking: Reported on 06/19/2021 10/16/15   [provider]  traZODone (DESYREL) 50 MG tablet Take 50-150 mg by mouth at bedtime. Patient not taking: Reported on 06/19/2021 04/01/21   [provider]  hyoscyamine (LEVSIN/SL) 0.125 MG SL tablet Place 1 tablet (0.125 mg total) under the tongue every 6 (six) hours as needed for cramping. Patient not taking: Reported on 01/16/2015 07/23/14 09/27/15  [name], MD    Allergies    Scopolamine  Review of Systems   Review of Systems  Constitutional:  Negative for chills and fever.  Respiratory:  Negative for shortness of breath.   Cardiovascular:  Negative for chest pain.  Gastrointestinal:  Positive for abdominal pain, nausea and vomiting. Negative for constipation and diarrhea.  Genitourinary:  Negative for dysuria, flank pain, pelvic pain and urgency.  Musculoskeletal:  Negative for back pain.  All other systems reviewed and are negative.  Physical Exam Updated Vital Signs BP 129/75   Pulse 76   Temp 98.6 F (37 C) (Oral)   Resp 18   Ht '5\' 10"'$  (1.778 m)   Wt 113.4 kg   LMP 09/20/2015 (Approximate)   SpO2 100%   BMI 35.87 kg/m   Physical Exam Vitals and nursing note reviewed.  Constitutional:      Appearance: Normal appearance.  HENT:     Head: Normocephalic and atraumatic.  Eyes:     Conjunctiva/sclera: Conjunctivae normal.  Cardiovascular:     Rate and Rhythm: Normal rate and regular rhythm.  Pulmonary:     Effort: Pulmonary effort is normal. No respiratory distress.     Breath sounds: Normal breath sounds.  Abdominal:      General: There is no distension.     Palpations: Abdomen is soft.     Tenderness: There is abdominal tenderness in the right upper quadrant, epigastric area and periumbilical area. There is no guarding or rebound.  Skin:    General: Skin is warm and dry.  Neurological:     General: No focal deficit present.     Mental Status: She is alert.    ED Results / Procedures / Treatments   Labs (all labs ordered are listed, but only abnormal results are displayed) Labs Reviewed  COMPREHENSIVE METABOLIC PANEL - Abnormal; Notable for the following components:      Result Value   Glucose,  Bld 105 (*)    All other components within normal limits  URINALYSIS, ROUTINE W REFLEX MICROSCOPIC - Abnormal; Notable for the following components:   Color, Urine STRAW (*)    Specific Gravity, Urine 1.004 (*)    All other components within normal limits  SARS CORONAVIRUS 2 (TAT 6-24 HRS)  LIPASE, BLOOD  CBC    EKG None  Radiology CT ABDOMEN PELVIS W CONTRAST  Result Date: 06/19/2021 CLINICAL DATA:  Abdominal pain, acute, nonlocalized EXAM: CT ABDOMEN AND PELVIS WITH CONTRAST TECHNIQUE: Multidetector CT imaging of the abdomen and pelvis was performed using the standard protocol following bolus administration of intravenous contrast. CONTRAST:  197m OMNIPAQUE IOHEXOL 300 MG/ML  SOLN COMPARISON:  04/08/2021 FINDINGS: Lower chest: No acute abnormality. Hepatobiliary: No focal liver abnormality. There is new gallbladder wall thickening/edema. No stones identified. No surrounding inflammatory changes. No biliary dilatation. Pancreas: Unremarkable. No pancreatic ductal dilatation or surrounding inflammatory changes. Spleen: Normal in size without focal abnormality. Adrenals/Urinary Tract: Adrenals, kidneys, and bladder are unremarkable. Stomach/Bowel: Stomach is within normal limits. Bowel is normal in caliber. Normal appendix. Vascular/Lymphatic: No significant vascular abnormality. No enlarged lymph nodes.  Reproductive: Post hysterectomy. Bilateral low-density adnexal lesions measuring up to 3.5 cm on the left. Other: No free fluid.  No acute abnormality of the abdominal wall. Musculoskeletal: No acute osseous abnormality. IMPRESSION: New gallbladder wall thickening/edema. Recommend right upper quadrant ultrasound. Bilateral low-density adnexal lesions measuring up to 3.5 cm. No follow-up imaging is recommended if patient is premenopausal. Electronically Signed   By: PMacy MisM.D.   On: 06/19/2021 15:07   [place]oreaAbdomen Limited RUQ (LIVER/GB)  Result Date: 06/19/2021 CLINICAL DATA:  Right upper quadrant abdominal pain with nausea for 1 month. EXAM: ULTRASOUND ABDOMEN LIMITED RIGHT UPPER QUADRANT COMPARISON:  Abdominal ultrasound 04/08/2021. Abdominal CT 04/08/2021 and 06/19/2021. FINDINGS: Gallbladder: Diffuse irregular gallbladder wall thickening to 8 mm. Gallstones were identified on previous ultrasound, not well seen currently. There is a small amount of pericholecystic fluid, and a positive sonographic MPercell Millersign was reported by the sonographer (also reported on prior ultrasound 10 weeks ago). Common bile duct: Diameter: 4 mm Liver: The hepatic echotexture is mildly heterogeneous. No focal lesions are identified. Portal vein is patent on color Doppler imaging with normal direction of blood flow towards the liver. Other: No visualized ascites. IMPRESSION: 1. New diffuse gallbladder wall thickening with cholelithiasis and sonographic Murphy sign, suspicious for acute cholecystitis. Correlate clinically. 2. No evidence of biliary dilatation. Electronically Signed   By: WRichardean SaleM.D.   On: 06/19/2021 16:49    Procedures Procedures   Medications Ordered in ED Medications  ondansetron (ZOFRAN-ODT) disintegrating tablet 4 mg (4 mg Oral Given 06/19/21 1200)  fentaNYL (SUBLIMAZE) injection 50 mcg (50 mcg Intravenous Given 06/19/21 1334)  sodium chloride 0.9 % bolus 1,000 mL (1,000 mLs Intravenous New  Bag/Given 06/19/21 1345)  iohexol (OMNIPAQUE) 300 MG/ML solution 100 mL (100 mLs Intravenous Contrast Given 06/19/21 1438)  fentaNYL (SUBLIMAZE) injection 50 mcg (50 mcg Intravenous Given 06/19/21 1546)  HYDROmorphone (DILAUDID) injection 1 mg (1 mg Intravenous Given 06/19/21 1739)    ED Course  I have reviewed the triage vital signs and the nursing notes.  Pertinent labs & imaging results that were available during my care of the patient were reviewed by me and considered in my medical decision making (see chart for details).    MDM Rules/Calculators/A&P  Patient is 40 y/o female who presents with abdominal pain x 1 month. Was seen in ED on 7/6 and dx with gallstones without evidence of cholecystitis, as well as left ovarian cyst. No follow up for this. Since then pain is worsening with persistent vomiting. On exam patient is afebrile, abdomen soft with tenderness to palpation of RUQ and epigastrium.   CBC and CMP unremarkable. Lipase normal. CT abdomen pelvis shows no gallbladder wall thickening. Discussed with Dr Constance Haw with general surgery who recommended RUQ ultrasound. If evidence of cholecystitis, patient will require admission with plans to go to the OR on Monday.   RUQ US shows new diffuse gallbladder wall thickening and positive sonographic Murphy's sign with concern for acute cholecystitis. Consulted hospitalist who accepted patient for admission.   Final Clinical Impression(s) / ED Diagnoses Final diagnoses:  RUQ pain  Cholecystitis    Rx / DC Orders ED Discharge Orders     None        Estill Cotta 06/19/21 1740    Milton Ferguson, MD 06/22/21 [phone]

## 2021-06-19 NOTE — H&P (Signed)
History and Physical    Amy Wang [id number] DOB: 02-Aug-1981 DOA: 06/19/2021  PCP: Celene Squibb, MD (Confirm with patient/family/NH records and if not entered, this has to be entered at [facility] point of entry) Patient coming from: Home  I have personally briefly reviewed patient's old medical records in [place]  Chief Complaint: Belly hurts  HPI: Amy Wang is a 40 y.o. 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  Patient started to have RUQ cramping-like abdominal pain usually after eating a meal, on and off, no fever chills.  She came in in July, and work-up showed she had cholelithiasis but no symptoms or signs of cholecystitis and sent home and was told to follow-up with GI versus general surgery.  Last night, after eating dinner, she started to experience similar abdominal pain, but this time the pain became constant, and this morning she started feeling nauseous and vomited 2 times with stomach content, no bile no blood.  ED Course: Vital signs stable, no tachycardia, afebrile.  WBC within normal limits, LFTs and lipase within normal limits.  Imaging study, CT abdomen showed compared CT scan in July there is a new abdominal wall thickening/edema, RUQ ultrasound confirmed positive ultrasound Murphy sign's and diffused gallbladder wall thickening suspicious for acute cholecystitis.  General surgery contacted recommend [facility] and [facility] on Monday.  Review of Systems: As per HPI otherwise 14 point review of systems negative.    Past Medical History:  Diagnosis Date   Anxiety    Arthritis    back   Bulging lumbar disc 01/02/2013   GERD (gastroesophageal reflux disease)    IBS (irritable bowel syndrome)    Neuropathy    Thyroid disease     Past Surgical History:  Procedure Laterality Date   BREAST SURGERY      BUNIONECTOMY Right    COLONOSCOPY N/A 11/22/2013   Procedure: COLONOSCOPY;  Surgeon: Rogene Houston, MD;  Location[facility];  Service: Endoscopy;  Laterality: N/A;  [place]     as child   tummy tuck  2010     reports that she has never smoked. She has never used smokeless tobacco. She reports that she does not currently use alcohol. She reports that she does not use drugs.  Allergies  Allergen Reactions   Scopolamine Nausea And Vomiting    TRAMSDERM    Family History  Problem Relation Age of Onset   Heart disease Father    Diabetes type II Father    Transient ischemic attack Mother    Colon polyps Son      Prior to Admission medications   Medication Sig Start Date End Date Taking? Authorizing Provider  clonazePAM (KLONOPIN) 1 MG tablet Take 1 mg by mouth 3 (three) times daily. 06/15/21  Yes [provider]  LATUDA 40 MG TABS tablet Take 1 tablet by mouth daily. 06/05/21  Yes [provider]  levothyroxine (SYNTHROID, LEVOTHROID) 75 MCG tablet Take 1 tablet (75 mcg total) by mouth daily. 10/24/15  Yes Philemon Kingdom, MD  metoprolol tartrate (LOPRESSOR) 50 MG tablet Take 50 mg by mouth daily. 05/01/21  Yes [provider]  morphine (MS CONTIN) 30 MG 12 hr tablet Take 30 mg by mouth every 12 (twelve) hours.   Yes [provider]  pregabalin (LYRICA) 200 MG capsule TAKE  1 CAPSULE BY MOUTH THREE TIMES DAILY. 08/04/20 06/19/21 Yes Barton Fanny, NP  ALPRAZolam Duanne Moron) 1 MG tablet Take 1 mg by mouth 3 (three) times daily as needed for anxiety.  Patient not taking: No sig reported    [provider]  amphetamine-dextroamphetamine (ADDERALL) 30 MG tablet Take 30 mg by mouth daily. Patient not taking: Reported on 06/19/2021    [provider]  Carbamazepine (EQUETRO) 100 MG CP12 12 hr capsule Take by mouth. Patient not taking: No sig reported    [provider]  ciprofloxacin (CIPRO) 500  MG tablet Take 1 tablet (500 mg total) by mouth 2 (two) times daily. Patient not taking: No sig reported 10/11/20   Faustino Congress, NP  DULoxetine (CYMBALTA) 30 MG capsule Take 90 mg by mouth every morning. Patient not taking: No sig reported    [provider]  fenofibrate micronized (LOFIBRA) 67 MG capsule Take 67 mg by mouth daily. Patient not taking: Reported on 06/19/2021 12/16/20   [provider]  FLUoxetine (PROZAC) 20 MG capsule TAKE 3 CAPSULES BY MOUTH ONCE DAILY. Patient not taking: No sig reported 08/04/20 08/04/21  Barton Fanny, NP  ondansetron (ZOFRAN) 4 MG tablet Take 1 tablet (4 mg total) by mouth every 8 (eight) hours as needed for nausea or vomiting. Patient not taking: Reported on 06/19/2021 04/08/21   Domenic Moras, PA-C  oxyCODONE-acetaminophen (PERCOCET) 7.5-325 MG tablet Take 1 tablet by mouth every 6 (six) hours as needed for moderate pain. Patient not taking: Reported on 06/19/2021 10/16/15   [provider]  traZODone (DESYREL) 50 MG tablet Take 50-150 mg by mouth at bedtime. Patient not taking: Reported on 06/19/2021 04/01/21   [provider]  hyoscyamine (LEVSIN/SL) 0.125 MG SL tablet Place 1 tablet (0.125 mg total) under the tongue every 6 (six) hours as needed for cramping. Patient not taking: Reported on 01/16/2015 07/23/14 09/27/15  [name], MD    Physical Exam: Vitals:   06/19/21 1435 06/19/21 1500 06/19/21 1530 06/19/21 1600  BP: 120/76 (!) 141/94 (!) 151/101 129/75  Pulse: 69 71 (!) 37 76  Resp: '16 16 16 18  '$ Temp:      TempSrc:      SpO2: 99% 98% 96% 100%  Weight:      Height:        Constitutional: NAD, calm, comfortable Vitals:   06/19/21 1435 06/19/21 1500 06/19/21 1530 06/19/21 1600  BP: 120/76 (!) 141/94 (!) 151/101 129/75  Pulse: 69 71 (!) 37 76  Resp: '16 16 16 18  '$ Temp:      TempSrc:      SpO2: 99% 98% 96% 100%  Weight:      Height:       Eyes: PERRL, lids and conjunctivae normal ENMT: Mucous  membranes are moist. Posterior pharynx clear of any exudate or lesions.Normal dentition.  Neck: normal, supple, no masses, no thyromegaly Respiratory: clear to auscultation bilaterally, no wheezing, no crackles. Normal respiratory effort. No accessory muscle use.  Cardiovascular: Regular rate and rhythm, no murmurs / rubs / gallops. No extremity edema. 2+ pedal pulses. No carotid bruits.  Abdomen: positive tenderness with some guarding but no rebound, no masses palpated. No hepatosplenomegaly. Bowel sounds positive.  Musculoskeletal: no clubbing / cyanosis. No joint deformity upper and lower extremities. Good ROM, no contractures. Normal muscle tone.  Skin: no rashes, lesions, ulcers. No induration Neurologic: CN 2-12 grossly intact. Sensation intact, DTR normal. Strength 5/5 in all 4.  Psychiatric: Normal judgment and insight. Alert and  oriented x 3. Normal mood.     Labs on Admission: I have personally reviewed following labs and imaging studies  CBC: Recent Labs  Lab 06/19/21 1141  WBC 9.6  HGB 12.9  HCT 36.5  MCV 91.5  PLT [id number]   Basic Metabolic Panel: Recent Labs  Lab 06/19/21 1141  NA 138  K 4.2  CL 106  CO2 25  GLUCOSE 105*  BUN 11  CREATININE 0.53  CALCIUM 8.9   GFR: Estimated Creatinine Clearance: 125.1 mL/min (by C-G formula based on SCr of 0.53 mg/dL). Liver Function Tests: Recent Labs  Lab 06/19/21 1141  AST 27  ALT 24  ALKPHOS 74  BILITOT 0.3  PROT 6.7  ALBUMIN 3.8   Recent Labs  Lab 06/19/21 1141  LIPASE 32   No results for input(s): AMMONIA in the last 168 hours. Coagulation Profile: No results for input(s): INR, PROTIME in the last 168 hours. Cardiac Enzymes: No results for input(s): CKTOTAL, CKMB, CKMBINDEX, TROPONINI in the last 168 hours. BNP (last 3 results) No results for input(s): PROBNP in the last 8760 hours. HbA1C: No results for input(s): HGBA1C in the last 72 hours. CBG: No results for input(s): GLUCAP in the last 168  hours. Lipid Profile: No results for input(s): CHOL, HDL, LDLCALC, TRIG, CHOLHDL, LDLDIRECT in the last 72 hours. Thyroid Function Tests: No results for input(s): TSH, T4TOTAL, FREET4, T3FREE, THYROIDAB in the last 72 hours. Anemia Panel: No results for input(s): VITAMINB12, FOLATE, FERRITIN, TIBC, IRON, RETICCTPCT in the last 72 hours. Urine analysis:    Component Value Date/Time   COLORURINE STRAW (A) 06/19/2021 1301   APPEARANCEUR CLEAR 06/19/2021 1301   LABSPEC 1.004 (L) 06/19/2021 1301   PHURINE 8.0 06/19/2021 1301   GLUCOSEU NEGATIVE 06/19/2021 1301   HGBUR NEGATIVE 06/19/2021 1301   BILIRUBINUR NEGATIVE 06/19/2021 1301   BILIRUBINUR negative 04/08/2021 1534   KETONESUR NEGATIVE 06/19/2021 1301   PROTEINUR NEGATIVE 06/19/2021 1301   UROBILINOGEN 0.2 04/08/2021 1534   UROBILINOGEN 0.2 07/23/2014 1648   NITRITE NEGATIVE 06/19/2021 1301   LEUKOCYTESUR NEGATIVE 06/19/2021 1301    Radiological Exams on Admission: CT ABDOMEN PELVIS W CONTRAST  Result Date: 06/19/2021 CLINICAL DATA:  Abdominal pain, acute, nonlocalized EXAM: CT ABDOMEN AND PELVIS WITH CONTRAST TECHNIQUE: Multidetector CT imaging of the abdomen and pelvis was performed using the standard protocol following bolus administration of intravenous contrast. CONTRAST:  168m OMNIPAQUE IOHEXOL 300 MG/ML  SOLN COMPARISON:  04/08/2021 FINDINGS: Lower chest: No acute abnormality. Hepatobiliary: No focal liver abnormality. There is new gallbladder wall thickening/edema. No stones identified. No surrounding inflammatory changes. No biliary dilatation. Pancreas: Unremarkable. No pancreatic ductal dilatation or surrounding inflammatory changes. Spleen: Normal in size without focal abnormality. Adrenals/Urinary Tract: Adrenals, kidneys, and bladder are unremarkable. Stomach/Bowel: Stomach is within normal limits. Bowel is normal in caliber. Normal appendix. Vascular/Lymphatic: No significant vascular abnormality. No enlarged lymph nodes.  Reproductive: Post hysterectomy. Bilateral low-density adnexal lesions measuring up to 3.5 cm on the left. Other: No free fluid.  No acute abnormality of the abdominal wall. Musculoskeletal: No acute osseous abnormality. IMPRESSION: New gallbladder wall thickening/edema. Recommend right upper quadrant ultrasound. Bilateral low-density adnexal lesions measuring up to 3.5 cm. No follow-up imaging is recommended if patient is premenopausal. Electronically Signed   By: PMacy MisM.D.   On: 06/19/2021 15:07   [place]oreaAbdomen Limited RUQ (LIVER/GB)  Result Date: 06/19/2021 CLINICAL DATA:  Right upper quadrant abdominal pain with nausea for 1 month. EXAM: ULTRASOUND ABDOMEN LIMITED RIGHT UPPER QUADRANT COMPARISON:  Abdominal ultrasound 04/08/2021. Abdominal CT 04/08/2021 and 06/19/2021. FINDINGS: Gallbladder: Diffuse irregular gallbladder wall thickening to 8 mm. Gallstones were identified on previous ultrasound, not well seen currently. There is a small amount of pericholecystic fluid, and a positive sonographic Percell Miller sign was reported by the sonographer (also reported on prior ultrasound 10 weeks ago). Common bile duct: Diameter: 4 mm Liver: The hepatic echotexture is mildly heterogeneous. No focal lesions are identified. Portal vein is patent on color Doppler imaging with normal direction of blood flow towards the liver. Other: No visualized ascites. IMPRESSION: 1. New diffuse gallbladder wall thickening with cholelithiasis and sonographic Murphy sign, suspicious for acute cholecystitis. Correlate clinically. 2. No evidence of biliary dilatation. Electronically Signed   By: Richardean Sale M.D.   On: 06/19/2021 16:49    EKG: None  Assessment/Plan Active Problems:   Acute cholecystitis   Cholecystitis  (please populate well all problems here in Problem List. (For example, if patient is on BP meds at home and you resume or decide to hold them, it is a problem that needs to be her. Same for CAD, COPD, HLD and  so on)  Acute cholecystitis -Given the sudden onset/worsening of GI symptoms, and image study, compatible with acute cholecystitis, blood cultures x2, start ceftriaxone plus Flagyl regimen.  General surgery consulted, recommend patient admitted and scheduled for the OR on Monday. -Pain medications -Trial of liquid diet, n.p.o. after midnight on Sunday.  Chronic idiopathic neuropathy -Has been follow-up with [facility] neurology, main symptoms has been bilateral lower extremity pain and balance problems, current regimen is morphine and Lyrica.  Has been stable for last 7 years.  Hypothyroidism -Continue Synthroid  History of sinus tachycardia -Stable on metoprolol.  Anxiety/depression -Continue home psychiatric meds  Morbid obesity -Recommend calorie control, outpatient PCP follow-up  DVT prophylaxis: SCD Code Status: Full Code Family Communication: None at bedside Disposition Plan: Expect more than 2 midnight hospital stay, expect surgery intervention. Consults called: General surgery Admission status: MedSurg admission   Lequita Halt MD [facility] Pager [phone]  06/19/2021, 5:51 PM

## 2021-06-19 NOTE — ED Triage Notes (Signed)
Pt c/o RUQ abd pain 1 month that is gradually worsening. N/V started last week. Pt was seen a month ago in the ED and dx with ovarian cyst. Pt has not had any follow up.

## 2021-06-19 NOTE — ED Notes (Signed)
Pt ambulated to bathroom with steady gait, states pain improved since arrival, awaiting  EDprovider

## 2021-06-19 NOTE — ED Notes (Signed)
Patient transported to CT 

## 2021-06-20 DIAGNOSIS — K810 Acute cholecystitis: Principal | ICD-10-CM

## 2021-06-20 DIAGNOSIS — R1011 Right upper quadrant pain: Secondary | ICD-10-CM

## 2021-06-20 LAB — COMPREHENSIVE METABOLIC PANEL
ALT: 22 U/L (ref 0–44)
AST: 19 U/L (ref 15–41)
Albumin: 3.4 g/dL — ABNORMAL LOW (ref 3.5–5.0)
Alkaline Phosphatase: 60 U/L (ref 38–126)
Anion gap: 10 (ref 5–15)
BUN: 8 mg/dL (ref 6–20)
CO2: 25 mmol/L (ref 22–32)
Calcium: 8.7 mg/dL — ABNORMAL LOW (ref 8.9–10.3)
Chloride: 104 mmol/L (ref 98–111)
Creatinine, Ser: 0.58 mg/dL (ref 0.44–1.00)
GFR, Estimated: >60 mL/min (ref >60)
Glucose, Bld: 90 mg/dL (ref 70–99)
Potassium: 4 mmol/L (ref 3.5–5.1)
Sodium: 139 mmol/L (ref 135–145)
Total Bilirubin: 0.5 mg/dL (ref 0.3–1.2)
Total Protein: 6.1 g/dL — ABNORMAL LOW (ref 6.5–8.1)

## 2021-06-20 LAB — HIV ANTIBODY (ROUTINE TESTING W REFLEX): HIV Screen 4th Generation wRfx: NONREACTIVE

## 2021-06-20 LAB — CBC
HCT: 34.6 % — ABNORMAL LOW (ref 36.0–46.0)
Hemoglobin: 11.8 g/dL — ABNORMAL LOW (ref 12.0–15.0)
MCH: 31.7 pg (ref 26.0–34.0)
MCHC: 34.1 g/dL (ref 30.0–36.0)
MCV: 93 fL (ref 80.0–100.0)
Platelets: 183 10*3/uL (ref 150–400)
RBC: 3.72 MIL/uL — ABNORMAL LOW (ref 3.87–5.11)
RDW: 12.3 % (ref 11.5–15.5)
WBC: 10 10*3/uL (ref 4.0–10.5)
nRBC: 0 % (ref 0.0–0.2)

## 2021-06-20 LAB — SARS CORONAVIRUS 2 (TAT 6-24 HRS): SARS Coronavirus 2: NEGATIVE

## 2021-06-20 MED ORDER — OXYCODONE HCL 5 MG PO TABS: 5.0000 mg | ORAL_TABLET | Freq: Four times a day (QID) | ORAL | Status: DC

## 2021-06-20 MED ORDER — OXYCODONE-ACETAMINOPHEN 5-325 MG PO TABS: 1.0000 | ORAL_TABLET | Freq: Four times a day (QID) | ORAL | Status: DC

## 2021-06-20 MED ORDER — ALPRAZOLAM 1 MG PO TABS
1.0000 mg | ORAL_TABLET | Freq: Three times a day (TID) | ORAL | Status: DC | PRN
  Administered 2021-06-22 (×3): 1 mg via ORAL
  Filled 2021-06-20 (×3): qty 1

## 2021-06-20 MED ORDER — HYDROMORPHONE HCL 1 MG/ML IJ SOLN
0.5000 mg | INTRAMUSCULAR | Status: DC | PRN
  Administered 2021-06-20 – 2021-06-23 (×13): 0.5 mg via INTRAVENOUS
  Filled 2021-06-20 (×13): qty 0.5

## 2021-06-20 MED ORDER — OXYCODONE-ACETAMINOPHEN 5-325 MG PO TABS
1.0000 | ORAL_TABLET | Freq: Four times a day (QID) | ORAL | Status: DC
Start: 2021-06-20 — End: 2021-06-23
  Administered 2021-06-20 – 2021-06-23 (×11): 1 via ORAL
  Filled 2021-06-20 (×11): qty 1

## 2021-06-20 MED ORDER — OXYCODONE HCL 5 MG PO TABS
5.0000 mg | ORAL_TABLET | Freq: Four times a day (QID) | ORAL | Status: DC
  Administered 2021-06-20 – 2021-06-23 (×11): 5 mg via ORAL
  Filled 2021-06-20 (×11): qty 1

## 2021-06-20 NOTE — Progress Notes (Signed)
Patient seen chart review; case discussed at bedside with general surgery who felt patient is having cholelithiasis, with biliary colic and early development of cholecystitis.  Plan of care will include the need of cholecystectomy and at this moment will be provided on Monday, 06/22/2021; patient otherwise medically stable from her chronic conditions and general surgery feel that the patient care can be transferred to their service.  Internal medicine will remain available for any needs.  Barton Dubois MD [phone]

## 2021-06-20 NOTE — H&P (View-Only) (Signed)
[facility] Consult  Reason for Consult: Acute cholecystitis  Referring Physician: Dr. Dyann Kief  Chief Complaint   Abdominal Pain     HPI: Amy Wang is a 40 y.o. female with acute cholecystitis on [place] with complaints of pain in the RUQ and associated nausea. She has been having pain on and off for the last month or two and says that she has had episodes of vomiting. She tries to stay away from greasy and fatty foods so she cannot be sure that this triggers anything. She says that her pain was worse yesterday and she came to the ED. She has chronic neuropathy and pain and is followed by her neurologist for pain. She is on MS Contin and Percocet 10 mg QID she reports. She also has hypothyroidism. She is feeling a little better this AM and has been able to drink some stuff.   Past Medical History:  Diagnosis Date   Anxiety    Arthritis    back   Bulging lumbar disc 01/02/2013   GERD (gastroesophageal reflux disease)    IBS (irritable bowel syndrome)    Neuropathy    Thyroid disease     Past Surgical History:  Procedure Laterality Date   BREAST SURGERY     BUNIONECTOMY Right    COLONOSCOPY N/A 11/22/2013   Procedure: COLONOSCOPY;  Surgeon: Rogene Houston, MD;  Location: [facility];  Service: Endoscopy;  Laterality: N/A;  [place]     as child   tummy tuck  2010    Family History  Problem Relation Age of Onset   Heart disease Father    Diabetes type II Father    Transient ischemic attack Mother    Colon polyps Son     Social History   Tobacco Use   Smoking status: Never   Smokeless tobacco: Never  Vaping Use   Vaping Use: Never used  Substance Use Topics   Alcohol use: Not Currently    Comment: social   Drug use: No    Medications: I have reviewed the patient's current medications. Prior to Admission:  Medications Prior to Admission  Medication Sig Dispense Refill Last Dose   clonazePAM (KLONOPIN) 1 MG  tablet Take 1 mg by mouth 3 (three) times daily.   06/19/2021   LATUDA 40 MG TABS tablet Take 1 tablet by mouth daily.   06/19/2021   levothyroxine (SYNTHROID, LEVOTHROID) 75 MCG tablet Take 1 tablet (75 mcg total) by mouth daily. 60 tablet 1 06/19/2021   metoprolol tartrate (LOPRESSOR) 50 MG tablet Take 50 mg by mouth daily.   06/19/2021 at 0800   morphine (MS CONTIN) 30 MG 12 hr tablet Take 30 mg by mouth every 12 (twelve) hours.   06/19/2021   pregabalin (LYRICA) 200 MG capsule TAKE 1 CAPSULE BY MOUTH THREE TIMES DAILY. 90 capsule 5 06/18/2021   ALPRAZolam (XANAX) 1 MG tablet Take 1 mg by mouth 3 (three) times daily as needed for anxiety.  (Patient not taking: No sig reported)   Not Taking   amphetamine-dextroamphetamine (ADDERALL) 30 MG tablet Take 30 mg by mouth daily. (Patient not taking: Reported on 06/19/2021)   Not Taking   Carbamazepine (EQUETRO) 100 MG CP12 12 hr capsule Take by mouth. (Patient not taking: No sig reported)   Not Taking   ciprofloxacin (CIPRO) 500 MG tablet Take 1 tablet (500 mg total) by mouth 2 (two) times daily. (Patient not taking: No sig reported)  14 tablet 0 Not Taking   DULoxetine (CYMBALTA) 30 MG capsule Take 90 mg by mouth every morning. (Patient not taking: No sig reported)   Not Taking   fenofibrate micronized (LOFIBRA) 67 MG capsule Take 67 mg by mouth daily. (Patient not taking: Reported on 06/19/2021)   Not Taking   FLUoxetine (PROZAC) 20 MG capsule TAKE 3 CAPSULES BY MOUTH ONCE DAILY. (Patient not taking: No sig reported) 90 capsule 5 Not Taking   ondansetron (ZOFRAN) 4 MG tablet Take 1 tablet (4 mg total) by mouth every 8 (eight) hours as needed for nausea or vomiting. (Patient not taking: Reported on 06/19/2021) 12 tablet 0 Not Taking   oxyCODONE-acetaminophen (PERCOCET) 7.5-325 MG tablet Take 1 tablet by mouth every 6 (six) hours as needed for moderate pain. (Patient not taking: Reported on 06/19/2021)  0 Not Taking   traZODone (DESYREL) 50 MG tablet Take 50-150 mg  by mouth at bedtime. (Patient not taking: Reported on 06/19/2021)   Not Taking   Scheduled:  clonazePAM  1 mg Oral TID   levothyroxine  75 mcg Oral QAC breakfast   lurasidone  40 mg Oral Daily   metoprolol tartrate  50 mg Oral Daily   metroNIDAZOLE  500 mg Oral Q12H   oxyCODONE-acetaminophen  1 tablet Oral Q6H   And   oxyCODONE  5 mg Oral Q6H   pregabalin  200 mg Oral TID   Continuous:  cefTRIAXone (ROCEPHIN)  IV 2 g (06/19/21 1842)   [id number], HYDROmorphone (DILAUDID) injection, ketorolac, ondansetron **OR** ondansetron (ZOFRAN) IV  Allergies  Allergen Reactions   Scopolamine Nausea And Vomiting    TRAMSDERM     ROS:  A comprehensive review of systems was negative except for: Gastrointestinal: positive for abdominal pain, nausea, and vomiting Neurological: positive for neuropathy  BP 89/51- recorded incorrectly at 4AM  Blood pressure (!) 87/4, pulse 76, temperature 97.9 F (36.6 C), temperature source Oral, resp. rate 20, height '5\' 10"'$  (1.778 m), weight 118.5 kg, last menstrual period 09/20/2015, SpO2 96 %. Physical Exam Vitals reviewed.  HENT:     Head: Normocephalic.  Eyes:     Extraocular Movements: Extraocular movements intact.  Cardiovascular:     Rate and Rhythm: Normal rate.  Pulmonary:     Effort: Pulmonary effort is normal.  Abdominal:     General: There is no distension.     Palpations: Abdomen is soft.     Tenderness: There is abdominal tenderness in the right upper quadrant and epigastric area.  Musculoskeletal:     Comments: Moves all extremities   Skin:    General: Skin is warm.  Neurological:     General: No focal deficit present.     Mental Status: She is alert and oriented to person, place, and time.  Psychiatric:        Mood and Affect: Mood normal.        Behavior: Behavior normal.    Results: Results for orders placed or performed during the hospital encounter of 06/19/21 (from the past 48 hour(s))  Lipase, blood     Status: None    Collection Time: 06/19/21 11:41 AM  Result Value Ref Range   Lipase 32 11 - 51 U/L    Comment: Performed at [facility], [address] [place], [place] [address]  Comprehensive metabolic panel     Status: Abnormal   Collection Time: 06/19/21 11:41 AM  Result Value Ref Range   Sodium 138 135 - 145 mmol/L   Potassium 4.2 3.5 -  5.1 mmol/L   Chloride 106 98 - 111 mmol/L   CO2 25 22 - 32 mmol/L   Glucose, Bld 105 (H) 70 - 99 mg/dL    Comment: Glucose reference range applies only to samples taken after fasting for at least 8 hours.   BUN 11 6 - 20 mg/dL   Creatinine, Ser 0.53 0.44 - 1.00 mg/dL   Calcium 8.9 8.9 - 10.3 mg/dL   Total Protein 6.7 6.5 - 8.1 g/dL   Albumin 3.8 3.5 - 5.0 g/dL   AST 27 15 - 41 U/L   ALT 24 0 - 44 U/L   Alkaline Phosphatase 74 38 - 126 U/L   Total Bilirubin 0.3 0.3 - 1.2 mg/dL   GFR, Estimated >60 >60 mL/min    Comment: (NOTE) Calculated using the CKD-EPI Creatinine Equation (2021)    Anion gap 7 5 - 15    Comment: Performed at [facility], [address] [place], [place] [address]  CBC     Status: None   Collection Time: 06/19/21 11:41 AM  Result Value Ref Range   WBC 9.6 4.0 - 10.5 K/uL   RBC 3.99 3.87 - 5.11 MIL/uL   Hemoglobin 12.9 12.0 - 15.0 g/dL   HCT 36.5 36.0 - 46.0 %   MCV 91.5 80.0 - 100.0 fL   MCH 32.3 26.0 - 34.0 pg   MCHC 35.3 30.0 - 36.0 g/dL   RDW 12.1 11.5 - 15.5 %   Platelets 217 150 - 400 K/uL   nRBC 0.0 0.0 - 0.2 %    Comment: Performed at [facility], [address] [place], [place] [address]  Urinalysis, Routine w reflex microscopic Urine, Clean Catch     Status: Abnormal   Collection Time: 06/19/21  1:01 PM  Result Value Ref Range   Color, Urine STRAW (A) YELLOW   APPearance CLEAR CLEAR   Specific Gravity, Urine 1.004 (L) 1.005 - 1.030   pH 8.0 5.0 - 8.0   Glucose, UA NEGATIVE NEGATIVE mg/dL   Hgb urine dipstick NEGATIVE NEGATIVE   Bilirubin Urine NEGATIVE NEGATIVE   Ketones, ur NEGATIVE NEGATIVE mg/dL   Protein,  ur NEGATIVE NEGATIVE mg/dL   Nitrite NEGATIVE NEGATIVE   Leukocytes,Ua NEGATIVE NEGATIVE    Comment: Performed at [facility], [address] [place], [place] [address]  Pregnancy, urine     Status: None   Collection Time: 06/19/21  1:01 PM  Result Value Ref Range   Preg Test, Ur NEGATIVE NEGATIVE    Comment:        THE SENSITIVITY OF THIS METHODOLOGY IS >20 mIU/mL. Performed at [facility], [address], [place],  [address]   SARS CORONAVIRUS 2 (TAT 6-24 HRS) Nasopharyngeal Nasopharyngeal Swab     Status: None   Collection Time: 06/19/21  5:24 PM   Specimen: Nasopharyngeal Swab  Result Value Ref Range   SARS Coronavirus 2 NEGATIVE NEGATIVE    Comment: (NOTE) SARS-CoV-2 target nucleic acids are NOT DETECTED.  The SARS-CoV-2 RNA is generally detectable in upper and lower respiratory specimens during the acute phase of infection. Negative results do not preclude SARS-CoV-2 infection, do not rule out co-infections with other pathogens, and should not be used as the sole basis for treatment or other patient management decisions. Negative results must be combined with clinical observations, patient history, and epidemiological information. The expected result is Negative.  Fact Sheet for Patients: SugarRoll.be  Fact Sheet for Healthcare Providers: https://www.woods-mathews.com/  This test is not yet approved or  cleared by the [place] and  has been authorized for detection and/or diagnosis of SARS-CoV-2 by FDA under an Emergency Use Authorization (EUA). This EUA will remain  in effect (meaning this test can be used) for the duration of the COVID-19 declaration under Se ction 564(b)(1) of the Act, 21 U.S.C. section 360bbb-3(b)(1), unless the authorization is terminated or revoked sooner.  Performed at [facility], [place] [address] [place], [place] [address]   HIV Antibody (routine testing w rflx)     Status:  None   Collection Time: 06/19/21  6:18 PM  Result Value Ref Range   HIV Screen 4th Generation wRfx Non Reactive Non Reactive    Comment: Performed at [facility], [place] [address], [place], [place] [address]  Culture, blood (routine x 2)     Status: None (Preliminary result)   Collection Time: 06/19/21  6:18 PM   Specimen: BLOOD LEFT HAND  Result Value Ref Range   Specimen Description BLOOD LEFT HAND    Special Requests      BOTTLES DRAWN AEROBIC AND ANAEROBIC Blood Culture adequate volume   Culture      NO GROWTH < 24 HOURS Performed at [facility], [address] [place], [place] [address]    Report Status PENDING   Culture, blood (routine x 2)     Status: None (Preliminary result)   Collection Time: 06/19/21  6:18 PM   Specimen: BLOOD RIGHT ARM  Result Value Ref Range   Specimen Description BLOOD RIGHT ARM    Special Requests      BOTTLES DRAWN AEROBIC AND ANAEROBIC Blood Culture results may not be optimal due to an excessive volume of blood received in culture bottles   Culture      NO GROWTH < 24 HOURS Performed at [facility], [address] [place], [place] [address]    Report Status PENDING   CBC     Status: Abnormal   Collection Time: 06/20/21  4:36 AM  Result Value Ref Range   WBC 10.0 4.0 - 10.5 K/uL   RBC 3.72 (L) 3.87 - 5.11 MIL/uL   Hemoglobin 11.8 (L) 12.0 - 15.0 g/dL   HCT 34.6 (L) 36.0 - 46.0 %   MCV 93.0 80.0 - 100.0 fL   MCH 31.7 26.0 - 34.0 pg   MCHC 34.1 30.0 - 36.0 g/dL   RDW 12.3 11.5 - 15.5 %   Platelets 183 150 - 400 K/uL   nRBC 0.0 0.0 - 0.2 %    Comment: Performed at [facility], [address] [place], [place] [address]  Comprehensive metabolic panel     Status: Abnormal   Collection Time: 06/20/21  4:36 AM  Result Value Ref Range   Sodium 139 135 - 145 mmol/L   Potassium 4.0 3.5 - 5.1 mmol/L   Chloride 104 98 - 111 mmol/L   CO2 25 22 - 32 mmol/L   Glucose, Bld 90 70 - 99 mg/dL    Comment: Glucose reference range applies only to  samples taken after fasting for at least 8 hours.   BUN 8 6 - 20 mg/dL   Creatinine, Ser 0.58 0.44 - 1.00 mg/dL   Calcium 8.7 (L) 8.9 - 10.3 mg/dL   Total Protein 6.1 (L) 6.5 - 8.1 g/dL   Albumin 3.4 (L) 3.5 - 5.0 g/dL   AST 19 15 - 41 U/L   ALT 22 0 - 44 U/L   Alkaline Phosphatase 60 38 - 126  U/L   Total Bilirubin 0.5 0.3 - 1.2 mg/dL   GFR, Estimated >60 >60 mL/min    Comment: (NOTE) Calculated using the CKD-EPI Creatinine Equation (2021)    Anion gap 10 5 - 15    Comment: Performed at [facility], [address], [place], [place] [address]   Personally reviewed- distended gallbladder with stones and thickened wall, pericholecystic fluid  CT ABDOMEN PELVIS W CONTRAST  Result Date: 06/19/2021 CLINICAL DATA:  Abdominal pain, acute, nonlocalized EXAM: CT ABDOMEN AND PELVIS WITH CONTRAST TECHNIQUE: Multidetector CT imaging of the abdomen and pelvis was performed using the standard protocol following bolus administration of intravenous contrast. CONTRAST:  124m OMNIPAQUE IOHEXOL 300 MG/ML  SOLN COMPARISON:  04/08/2021 FINDINGS: Lower chest: No acute abnormality. Hepatobiliary: No focal liver abnormality. There is new gallbladder wall thickening/edema. No stones identified. No surrounding inflammatory changes. No biliary dilatation. Pancreas: Unremarkable. No pancreatic ductal dilatation or surrounding inflammatory changes. Spleen: Normal in size without focal abnormality. Adrenals/Urinary Tract: Adrenals, kidneys, and bladder are unremarkable. Stomach/Bowel: Stomach is within normal limits. Bowel is normal in caliber. Normal appendix. Vascular/Lymphatic: No significant vascular abnormality. No enlarged lymph nodes. Reproductive: Post hysterectomy. Bilateral low-density adnexal lesions measuring up to 3.5 cm on the left. Other: No free fluid.  No acute abnormality of the abdominal wall. Musculoskeletal: No acute osseous abnormality. IMPRESSION: New gallbladder wall thickening/edema. Recommend right  upper quadrant ultrasound. Bilateral low-density adnexal lesions measuring up to 3.5 cm. No follow-up imaging is recommended if patient is premenopausal. Electronically Signed   By: PMacy MisM.D.   On: 06/19/2021 15:07   [place]domen Limited RUQ (LIVER/GB)  Result Date: 06/19/2021 CLINICAL DATA:  Right upper quadrant abdominal pain with nausea for 1 month. EXAM: ULTRASOUND ABDOMEN LIMITED RIGHT UPPER QUADRANT COMPARISON:  Abdominal ultrasound 04/08/2021. Abdominal CT 04/08/2021 and 06/19/2021. FINDINGS: Gallbladder: Diffuse irregular gallbladder wall thickening to 8 mm. Gallstones were identified on previous ultrasound, not well seen currently. There is a small amount of pericholecystic fluid, and a positive sonographic MPercell Millersign was reported by the sonographer (also reported on prior ultrasound 10 weeks ago). Common bile duct: Diameter: 4 mm Liver: The hepatic echotexture is mildly heterogeneous. No focal lesions are identified. Portal vein is patent on color Doppler imaging with normal direction of blood flow towards the liver. Other: No visualized ascites. IMPRESSION: 1. New diffuse gallbladder wall thickening with cholelithiasis and sonographic Murphy sign, suspicious for acute cholecystitis. Correlate clinically. 2. No evidence of biliary dilatation. Electronically Signed   By: WRichardean SaleM.D.   On: 06/19/2021 16:49     Assessment & Plan:  RIYLAH GHUMANis a 40y.o. female with acute cholecystitis. On antibiotics. Plans for cholecystectomy on Monday. Chronic pain and adjusted her meds for her to get percocet 10-325 q 6 hour and dilaudid PRN. Holding her MS Contin given the IV pain meds.   Diet now PLAN: I counseled the patient about the indication, risks and benefits of laparoscopic cholecystectomy.  She understands there is a very small chance for bleeding, infection, injury to normal structures (including common bile duct), conversion to open surgery, persistent symptoms, evolution  of postcholecystectomy diarrhea, need for secondary interventions, anesthesia reaction, cardiopulmonary issues and other risks not specifically detailed here. I described the expected recovery, the plan for follow-up and the restrictions during the recovery phase.  All questions were answered.   All questions were answered to the satisfaction of the patient.     LVirl Cagey9/17/2022, 11:33 AM

## 2021-06-20 NOTE — Consult Note (Signed)
[facility] Consult  Reason for Consult: Acute cholecystitis  Referring Physician: Dr. Dyann Kief  Chief Complaint   Abdominal Pain     HPI: Amy Wang is a 40 y.o. female with acute cholecystitis on [place] with complaints of pain in the RUQ and associated nausea. She has been having pain on and off for the last month or two and says that she has had episodes of vomiting. She tries to stay away from greasy and fatty foods so she cannot be sure that this triggers anything. She says that her pain was worse yesterday and she came to the ED. She has chronic neuropathy and pain and is followed by her neurologist for pain. She is on MS Contin and Percocet 10 mg QID she reports. She also has hypothyroidism. She is feeling a little better this AM and has been able to drink some stuff.   Past Medical History:  Diagnosis Date   Anxiety    Arthritis    back   Bulging lumbar disc 01/02/2013   GERD (gastroesophageal reflux disease)    IBS (irritable bowel syndrome)    Neuropathy    Thyroid disease     Past Surgical History:  Procedure Laterality Date   BREAST SURGERY     BUNIONECTOMY Right    COLONOSCOPY N/A 11/22/2013   Procedure: COLONOSCOPY;  Surgeon: Rogene Houston, MD;  Location: [facility];  Service: Endoscopy;  Laterality: N/A;  [place]     as child   tummy tuck  2010    Family History  Problem Relation Age of Onset   Heart disease Father    Diabetes type II Father    Transient ischemic attack Mother    Colon polyps Son     Social History   Tobacco Use   Smoking status: Never   Smokeless tobacco: Never  Vaping Use   Vaping Use: Never used  Substance Use Topics   Alcohol use: Not Currently    Comment: social   Drug use: No    Medications: I have reviewed the patient's current medications. Prior to Admission:  Medications Prior to Admission  Medication Sig Dispense Refill Last Dose   clonazePAM (KLONOPIN) 1 MG  tablet Take 1 mg by mouth 3 (three) times daily.   06/19/2021   LATUDA 40 MG TABS tablet Take 1 tablet by mouth daily.   06/19/2021   levothyroxine (SYNTHROID, LEVOTHROID) 75 MCG tablet Take 1 tablet (75 mcg total) by mouth daily. 60 tablet 1 06/19/2021   metoprolol tartrate (LOPRESSOR) 50 MG tablet Take 50 mg by mouth daily.   06/19/2021 at 0800   morphine (MS CONTIN) 30 MG 12 hr tablet Take 30 mg by mouth every 12 (twelve) hours.   06/19/2021   pregabalin (LYRICA) 200 MG capsule TAKE 1 CAPSULE BY MOUTH THREE TIMES DAILY. 90 capsule 5 06/18/2021   ALPRAZolam (XANAX) 1 MG tablet Take 1 mg by mouth 3 (three) times daily as needed for anxiety.  (Patient not taking: No sig reported)   Not Taking   amphetamine-dextroamphetamine (ADDERALL) 30 MG tablet Take 30 mg by mouth daily. (Patient not taking: Reported on 06/19/2021)   Not Taking   Carbamazepine (EQUETRO) 100 MG CP12 12 hr capsule Take by mouth. (Patient not taking: No sig reported)   Not Taking   ciprofloxacin (CIPRO) 500 MG tablet Take 1 tablet (500 mg total) by mouth 2 (two) times daily. (Patient not taking: No sig reported)  14 tablet 0 Not Taking   DULoxetine (CYMBALTA) 30 MG capsule Take 90 mg by mouth every morning. (Patient not taking: No sig reported)   Not Taking   fenofibrate micronized (LOFIBRA) 67 MG capsule Take 67 mg by mouth daily. (Patient not taking: Reported on 06/19/2021)   Not Taking   FLUoxetine (PROZAC) 20 MG capsule TAKE 3 CAPSULES BY MOUTH ONCE DAILY. (Patient not taking: No sig reported) 90 capsule 5 Not Taking   ondansetron (ZOFRAN) 4 MG tablet Take 1 tablet (4 mg total) by mouth every 8 (eight) hours as needed for nausea or vomiting. (Patient not taking: Reported on 06/19/2021) 12 tablet 0 Not Taking   oxyCODONE-acetaminophen (PERCOCET) 7.5-325 MG tablet Take 1 tablet by mouth every 6 (six) hours as needed for moderate pain. (Patient not taking: Reported on 06/19/2021)  0 Not Taking   traZODone (DESYREL) 50 MG tablet Take 50-150 mg  by mouth at bedtime. (Patient not taking: Reported on 06/19/2021)   Not Taking   Scheduled:  clonazePAM  1 mg Oral TID   levothyroxine  75 mcg Oral QAC breakfast   lurasidone  40 mg Oral Daily   metoprolol tartrate  50 mg Oral Daily   metroNIDAZOLE  500 mg Oral Q12H   oxyCODONE-acetaminophen  1 tablet Oral Q6H   And   oxyCODONE  5 mg Oral Q6H   pregabalin  200 mg Oral TID   Continuous:  cefTRIAXone (ROCEPHIN)  IV 2 g (06/19/21 1842)   [id number], HYDROmorphone (DILAUDID) injection, ketorolac, ondansetron **OR** ondansetron (ZOFRAN) IV  Allergies  Allergen Reactions   Scopolamine Nausea And Vomiting    TRAMSDERM     ROS:  A comprehensive review of systems was negative except for: Gastrointestinal: positive for abdominal pain, nausea, and vomiting Neurological: positive for neuropathy  BP 89/51- recorded incorrectly at 4AM  Blood pressure (!) 87/4, pulse 76, temperature 97.9 F (36.6 C), temperature source Oral, resp. rate 20, height '5\' 10"'$  (1.778 m), weight 118.5 kg, last menstrual period 09/20/2015, SpO2 96 %. Physical Exam Vitals reviewed.  HENT:     Head: Normocephalic.  Eyes:     Extraocular Movements: Extraocular movements intact.  Cardiovascular:     Rate and Rhythm: Normal rate.  Pulmonary:     Effort: Pulmonary effort is normal.  Abdominal:     General: There is no distension.     Palpations: Abdomen is soft.     Tenderness: There is abdominal tenderness in the right upper quadrant and epigastric area.  Musculoskeletal:     Comments: Moves all extremities   Skin:    General: Skin is warm.  Neurological:     General: No focal deficit present.     Mental Status: She is alert and oriented to person, place, and time.  Psychiatric:        Mood and Affect: Mood normal.        Behavior: Behavior normal.    Results: Results for orders placed or performed during the hospital encounter of 06/19/21 (from the past 48 hour(s))  Lipase, blood     Status: None    Collection Time: 06/19/21 11:41 AM  Result Value Ref Range   Lipase 32 11 - 51 U/L    Comment: Performed at [facility], [address], [place], [place] [address]  Comprehensive metabolic panel     Status: Abnormal   Collection Time: 06/19/21 11:41 AM  Result Value Ref Range   Sodium 138 135 - 145 mmol/L   Potassium 4.2 3.5 -  5.1 mmol/L   Chloride 106 98 - 111 mmol/L   CO2 25 22 - 32 mmol/L   Glucose, Bld 105 (H) 70 - 99 mg/dL    Comment: Glucose reference range applies only to samples taken after fasting for at least 8 hours.   BUN 11 6 - 20 mg/dL   Creatinine, Ser 0.53 0.44 - 1.00 mg/dL   Calcium 8.9 8.9 - 10.3 mg/dL   Total Protein 6.7 6.5 - 8.1 g/dL   Albumin 3.8 3.5 - 5.0 g/dL   AST 27 15 - 41 U/L   ALT 24 0 - 44 U/L   Alkaline Phosphatase 74 38 - 126 U/L   Total Bilirubin 0.3 0.3 - 1.2 mg/dL   GFR, Estimated >60 >60 mL/min    Comment: (NOTE) Calculated using the CKD-EPI Creatinine Equation (2021)    Anion gap 7 5 - 15    Comment: Performed at [facility], [address], [place], [place] [address]  CBC     Status: None   Collection Time: 06/19/21 11:41 AM  Result Value Ref Range   WBC 9.6 4.0 - 10.5 K/uL   RBC 3.99 3.87 - 5.11 MIL/uL   Hemoglobin 12.9 12.0 - 15.0 g/dL   HCT 36.5 36.0 - 46.0 %   MCV 91.5 80.0 - 100.0 fL   MCH 32.3 26.0 - 34.0 pg   MCHC 35.3 30.0 - 36.0 g/dL   RDW 12.1 11.5 - 15.5 %   Platelets 217 150 - 400 K/uL   nRBC 0.0 0.0 - 0.2 %    Comment: Performed at [facility], [address] [place], [place] [address]  Urinalysis, Routine w reflex microscopic Urine, Clean Catch     Status: Abnormal   Collection Time: 06/19/21  1:01 PM  Result Value Ref Range   Color, Urine STRAW (A) YELLOW   APPearance CLEAR CLEAR   Specific Gravity, Urine 1.004 (L) 1.005 - 1.030   pH 8.0 5.0 - 8.0   Glucose, UA NEGATIVE NEGATIVE mg/dL   Hgb urine dipstick NEGATIVE NEGATIVE   Bilirubin Urine NEGATIVE NEGATIVE   Ketones, ur NEGATIVE NEGATIVE mg/dL   Protein,  ur NEGATIVE NEGATIVE mg/dL   Nitrite NEGATIVE NEGATIVE   Leukocytes,Ua NEGATIVE NEGATIVE    Comment: Performed at [facility], [address], [place], [place] [address]  Pregnancy, urine     Status: None   Collection Time: 06/19/21  1:01 PM  Result Value Ref Range   Preg Test, Ur NEGATIVE NEGATIVE    Comment:        THE SENSITIVITY OF THIS METHODOLOGY IS >20 mIU/mL. Performed at [facility], [address], [place], [place] [address]   SARS CORONAVIRUS 2 (TAT 6-24 HRS) Nasopharyngeal Nasopharyngeal Swab     Status: None   Collection Time: 06/19/21  5:24 PM   Specimen: Nasopharyngeal Swab  Result Value Ref Range   SARS Coronavirus 2 NEGATIVE NEGATIVE    Comment: (NOTE) SARS-CoV-2 target nucleic acids are NOT DETECTED.  The SARS-CoV-2 RNA is generally detectable in upper and lower respiratory specimens during the acute phase of infection. Negative results do not preclude SARS-CoV-2 infection, do not rule out co-infections with other pathogens, and should not be used as the sole basis for treatment or other patient management decisions. Negative results must be combined with clinical observations, patient history, and epidemiological information. The expected result is Negative.  Fact Sheet for Patients: SugarRoll.be  Fact Sheet for Healthcare Providers: https://www.woods-mathews.com/  This test is not yet approved or  cleared by the [place] and  has been authorized for detection and/or diagnosis of SARS-CoV-2 by FDA under an Emergency Use Authorization (EUA). This EUA will remain  in effect (meaning this test can be used) for the duration of the COVID-19 declaration under Se ction 564(b)(1) of the Act, 21 U.S.C. section 360bbb-3(b)(1), unless the authorization is terminated or revoked sooner.  Performed at [facility], [place] [address] [place], [place] [address]   HIV Antibody (routine testing w rflx)     Status:  None   Collection Time: 06/19/21  6:18 PM  Result Value Ref Range   HIV Screen 4th Generation wRfx Non Reactive Non Reactive    Comment: Performed at [facility], [place] [address] [place], [place] [address]  Culture, blood (routine x 2)     Status: None (Preliminary result)   Collection Time: 06/19/21  6:18 PM   Specimen: BLOOD LEFT HAND  Result Value Ref Range   Specimen Description BLOOD LEFT HAND    Special Requests      BOTTLES DRAWN AEROBIC AND ANAEROBIC Blood Culture adequate volume   Culture      NO GROWTH < 24 HOURS Performed at [facility], [address] [place], [place] [address]    Report Status PENDING   Culture, blood (routine x 2)     Status: None (Preliminary result)   Collection Time: 06/19/21  6:18 PM   Specimen: BLOOD RIGHT ARM  Result Value Ref Range   Specimen Description BLOOD RIGHT ARM    Special Requests      BOTTLES DRAWN AEROBIC AND ANAEROBIC Blood Culture results may not be optimal due to an excessive volume of blood received in culture bottles   Culture      NO GROWTH < 24 HOURS Performed at [facility], [address], [place], [place] [address]    Report Status PENDING   CBC     Status: Abnormal   Collection Time: 06/20/21  4:36 AM  Result Value Ref Range   WBC 10.0 4.0 - 10.5 K/uL   RBC 3.72 (L) 3.87 - 5.11 MIL/uL   Hemoglobin 11.8 (L) 12.0 - 15.0 g/dL   HCT 34.6 (L) 36.0 - 46.0 %   MCV 93.0 80.0 - 100.0 fL   MCH 31.7 26.0 - 34.0 pg   MCHC 34.1 30.0 - 36.0 g/dL   RDW 12.3 11.5 - 15.5 %   Platelets 183 150 - 400 K/uL   nRBC 0.0 0.0 - 0.2 %    Comment: Performed at [facility], [address], [place], [place] [address]  Comprehensive metabolic panel     Status: Abnormal   Collection Time: 06/20/21  4:36 AM  Result Value Ref Range   Sodium 139 135 - 145 mmol/L   Potassium 4.0 3.5 - 5.1 mmol/L   Chloride 104 98 - 111 mmol/L   CO2 25 22 - 32 mmol/L   Glucose, Bld 90 70 - 99 mg/dL    Comment: Glucose reference range applies only to  samples taken after fasting for at least 8 hours.   BUN 8 6 - 20 mg/dL   Creatinine, Ser 0.58 0.44 - 1.00 mg/dL   Calcium 8.7 (L) 8.9 - 10.3 mg/dL   Total Protein 6.1 (L) 6.5 - 8.1 g/dL   Albumin 3.4 (L) 3.5 - 5.0 g/dL   AST 19 15 - 41 U/L   ALT 22 0 - 44 U/L   Alkaline Phosphatase 60 38 - 126  U/L   Total Bilirubin 0.5 0.3 - 1.2 mg/dL   GFR, Estimated >60 >60 mL/min    Comment: (NOTE) Calculated using the CKD-EPI Creatinine Equation (2021)    Anion gap 10 5 - 15    Comment: Performed at [facility], [address], [place], [place] [address]   Personally reviewed- distended gallbladder with stones and thickened wall, pericholecystic fluid  CT ABDOMEN PELVIS W CONTRAST  Result Date: 06/19/2021 CLINICAL DATA:  Abdominal pain, acute, nonlocalized EXAM: CT ABDOMEN AND PELVIS WITH CONTRAST TECHNIQUE: Multidetector CT imaging of the abdomen and pelvis was performed using the standard protocol following bolus administration of intravenous contrast. CONTRAST:  124m OMNIPAQUE IOHEXOL 300 MG/ML  SOLN COMPARISON:  04/08/2021 FINDINGS: Lower chest: No acute abnormality. Hepatobiliary: No focal liver abnormality. There is new gallbladder wall thickening/edema. No stones identified. No surrounding inflammatory changes. No biliary dilatation. Pancreas: Unremarkable. No pancreatic ductal dilatation or surrounding inflammatory changes. Spleen: Normal in size without focal abnormality. Adrenals/Urinary Tract: Adrenals, kidneys, and bladder are unremarkable. Stomach/Bowel: Stomach is within normal limits. Bowel is normal in caliber. Normal appendix. Vascular/Lymphatic: No significant vascular abnormality. No enlarged lymph nodes. Reproductive: Post hysterectomy. Bilateral low-density adnexal lesions measuring up to 3.5 cm on the left. Other: No free fluid.  No acute abnormality of the abdominal wall. Musculoskeletal: No acute osseous abnormality. IMPRESSION: New gallbladder wall thickening/edema. Recommend right  upper quadrant ultrasound. Bilateral low-density adnexal lesions measuring up to 3.5 cm. No follow-up imaging is recommended if patient is premenopausal. Electronically Signed   By: PMacy MisM.D.   On: 06/19/2021 15:07   UKoreaAbdomen Limited RUQ (LIVER/GB)  Result Date: 06/19/2021 CLINICAL DATA:  Right upper quadrant abdominal pain with nausea for 1 month. EXAM: ULTRASOUND ABDOMEN LIMITED RIGHT UPPER QUADRANT COMPARISON:  Abdominal ultrasound 04/08/2021. Abdominal CT 04/08/2021 and 06/19/2021. FINDINGS: Gallbladder: Diffuse irregular gallbladder wall thickening to 8 mm. Gallstones were identified on previous ultrasound, not well seen currently. There is a small amount of pericholecystic fluid, and a positive sonographic MPercell Millersign was reported by the sonographer (also reported on prior ultrasound 10 weeks ago). Common bile duct: Diameter: 4 mm Liver: The hepatic echotexture is mildly heterogeneous. No focal lesions are identified. Portal vein is patent on color Doppler imaging with normal direction of blood flow towards the liver. Other: No visualized ascites. IMPRESSION: 1. New diffuse gallbladder wall thickening with cholelithiasis and sonographic Murphy sign, suspicious for acute cholecystitis. Correlate clinically. 2. No evidence of biliary dilatation. Electronically Signed   By: WRichardean SaleM.D.   On: 06/19/2021 16:49     Assessment & Plan:  RKIMBERELY ARNETis a 40y.o. female with acute cholecystitis. On antibiotics. Plans for cholecystectomy on Monday. Chronic pain and adjusted her meds for her to get percocet 10-325 q 6 hour and dilaudid PRN. Holding her MS Contin given the IV pain meds.   Diet now PLAN: I counseled the patient about the indication, risks and benefits of laparoscopic cholecystectomy.  She understands there is a very small chance for bleeding, infection, injury to normal structures (including common bile duct), conversion to open surgery, persistent symptoms, evolution  of postcholecystectomy diarrhea, need for secondary interventions, anesthesia reaction, cardiopulmonary issues and other risks not specifically detailed here. I described the expected recovery, the plan for follow-up and the restrictions during the recovery phase.  All questions were answered.   All questions were answered to the satisfaction of the patient.     LVirl Cagey9/17/2022, 11:33 AM

## 2021-06-21 MED ORDER — CHLORHEXIDINE GLUCONATE CLOTH 2 % EX PADS
6.0000 | MEDICATED_PAD | Freq: Once | CUTANEOUS | Status: AC
  Administered 2021-06-22: 6 via TOPICAL

## 2021-06-21 MED ORDER — SODIUM CHLORIDE 0.9 % IV SOLN
2.0000 g | INTRAVENOUS | Status: DC
  Filled 2021-06-21: qty 2

## 2021-06-21 MED ORDER — ALUM & MAG HYDROXIDE-SIMETH 200-200-20 MG/5ML PO SUSP
30.0000 mL | Freq: Once | ORAL | Status: AC
  Administered 2021-06-21: 30 mL via ORAL
  Filled 2021-06-21: qty 30

## 2021-06-21 MED ORDER — CHLORHEXIDINE GLUCONATE CLOTH 2 % EX PADS
6.0000 | MEDICATED_PAD | Freq: Once | CUTANEOUS | Status: AC
  Administered 2021-06-21: 6 via TOPICAL

## 2021-06-21 MED ORDER — SODIUM CHLORIDE 0.9 % IV SOLN
2.0000 g | INTRAVENOUS | Status: AC
  Administered 2021-06-22: 2 g via INTRAVENOUS
  Filled 2021-06-21 (×2): qty 2

## 2021-06-21 NOTE — Progress Notes (Signed)
Patient has a full liquid diet, noted patient was eating a biscuit from family. Informed patient of her diet order. Patient stated, she could not keep having liquids it was causing loose stools. Made MD Bridges aware.

## 2021-06-21 NOTE — Progress Notes (Signed)
Patient refused to use the SCD machine due to patient being ambulatory. MD Bridges aware.

## 2021-06-21 NOTE — Progress Notes (Signed)
[facility] Progress Note     Subjective: Improving pain and tolerating some liquids. Bms are liquid.   Objective: Vital signs in last 24 hours: Temp:  [97.5 F (36.4 C)] 97.5 F (36.4 C) (09/18 0559) Pulse Rate:  [73-78] 73 (09/18 0559) Resp:  [20] 20 (09/18 0559) BP: (89-101)/(37-60) 89/37 (09/18 0559) SpO2:  [95 %-99 %] 99 % (09/18 0559) Last BM Date: 06/21/21  Intake/Output from previous day: 09/17 0701 - 09/18 0700 In: 680 [P.O.:480; IV Piggyback:200] Out: -  Intake/Output this shift: No intake/output data recorded.  General appearance: alert, cooperative, and no distress Resp: normal work of breathing GI: soft, tender with deep palpation   Lab Results:  Recent Labs    06/19/21 1141 06/20/21 0436  WBC 9.6 10.0  HGB 12.9 11.8*  HCT 36.5 34.6*  PLT 217 183   BMET Recent Labs    06/19/21 1141 06/20/21 0436  NA 138 139  K 4.2 4.0  CL 106 104  CO2 25 25  GLUCOSE 105* 90  BUN 11 8  CREATININE 0.53 0.58  CALCIUM 8.9 8.7*   PT/INR No results for input(s): LABPROT, INR in the last 72 hours.  Studies/Results: CT ABDOMEN PELVIS W CONTRAST  Result Date: 06/19/2021 CLINICAL DATA:  Abdominal pain, acute, nonlocalized EXAM: CT ABDOMEN AND PELVIS WITH CONTRAST TECHNIQUE: Multidetector CT imaging of the abdomen and pelvis was performed using the standard protocol following bolus administration of intravenous contrast. CONTRAST:  166m OMNIPAQUE IOHEXOL 300 MG/ML  SOLN COMPARISON:  04/08/2021 FINDINGS: Lower chest: No acute abnormality. Hepatobiliary: No focal liver abnormality. There is new gallbladder wall thickening/edema. No stones identified. No surrounding inflammatory changes. No biliary dilatation. Pancreas: Unremarkable. No pancreatic ductal dilatation or surrounding inflammatory changes. Spleen: Normal in size without focal abnormality. Adrenals/Urinary Tract: Adrenals, kidneys, and bladder are unremarkable. Stomach/Bowel: Stomach is within  normal limits. Bowel is normal in caliber. Normal appendix. Vascular/Lymphatic: No significant vascular abnormality. No enlarged lymph nodes. Reproductive: Post hysterectomy. Bilateral low-density adnexal lesions measuring up to 3.5 cm on the left. Other: No free fluid.  No acute abnormality of the abdominal wall. Musculoskeletal: No acute osseous abnormality. IMPRESSION: New gallbladder wall thickening/edema. Recommend right upper quadrant ultrasound. Bilateral low-density adnexal lesions measuring up to 3.5 cm. No follow-up imaging is recommended if patient is premenopausal. Electronically Signed   By: PMacy MisM.D.   On: 06/19/2021 15:07   UKoreaAbdomen Limited RUQ (LIVER/GB)  Result Date: 06/19/2021 CLINICAL DATA:  Right upper quadrant abdominal pain with nausea for 1 month. EXAM: ULTRASOUND ABDOMEN LIMITED RIGHT UPPER QUADRANT COMPARISON:  Abdominal ultrasound 04/08/2021. Abdominal CT 04/08/2021 and 06/19/2021. FINDINGS: Gallbladder: Diffuse irregular gallbladder wall thickening to 8 mm. Gallstones were identified on previous ultrasound, not well seen currently. There is a small amount of pericholecystic fluid, and a positive sonographic MPercell Millersign was reported by the sonographer (also reported on prior ultrasound 10 weeks ago). Common bile duct: Diameter: 4 mm Liver: The hepatic echotexture is mildly heterogeneous. No focal lesions are identified. Portal vein is patent on color Doppler imaging with normal direction of blood flow towards the liver. Other: No visualized ascites. IMPRESSION: 1. New diffuse gallbladder wall thickening with cholelithiasis and sonographic Murphy sign, suspicious for acute cholecystitis. Correlate clinically. 2. No evidence of biliary dilatation. Electronically Signed   By: WRichardean SaleM.D.   On: 06/19/2021 16:49    Anti-infectives: Anti-infectives (From admission, onward)    Start     Dose/Rate Route Frequency Ordered Stop   06/21/21  1115  cefoTEtan (CEFOTAN) 2 g  in sodium chloride 0.9 % 100 mL IVPB        2 g 200 mL/hr over 30 Minutes Intravenous On call to O.R. 06/21/21 1017 06/22/21 0559   06/19/21 2315  metroNIDAZOLE (FLAGYL) tablet 500 mg        500 mg Oral Every 12 hours 06/19/21 2219     06/19/21 1830  cefTRIAXone (ROCEPHIN) 2 g in sodium chloride 0.9 % 100 mL IVPB        2 g 200 mL/hr over 30 Minutes Intravenous Every 24 hours 06/19/21 1751     06/19/21 1830  metroNIDAZOLE (FLAGYL) IVPB 500 mg  Status:  Discontinued        500 mg 100 mL/hr over 60 Minutes Intravenous Every 12 hours 06/19/21 1751 06/19/21 2219       Assessment/Plan: Ms. Varney is a 40 yo with acute cholecystitis. Doing better with antibiotics. Plan for cholecystectomy tomorrow.   PLAN: I counseled the patient about the indication, risks and benefits of laparoscopic cholecystectomy.  She understands there is a very small chance for bleeding, infection, injury to normal structures (including common bile duct), conversion to open surgery, persistent symptoms, evolution of postcholecystectomy diarrhea, need for secondary interventions, anesthesia reaction, cardiopulmonary issues and other risks not specifically detailed here. I described the expected recovery, the plan for follow-up and the restrictions during the recovery phase.  All questions were answered.  NPO midnight.   LOS: 2 days    Virl Cagey 06/21/2021

## 2021-06-22 ENCOUNTER — Encounter (HOSPITAL_COMMUNITY)
Admission: EM | Disposition: A | Discharge status: Home / Self Care | Payer: Self-pay | Source: Home / Self Care | Attending: General Surgery

## 2021-06-22 ENCOUNTER — Inpatient Hospital Stay (HOSPITAL_COMMUNITY): Payer: BC Managed Care – PPO | Admitting: Anesthesiology

## 2021-06-22 ENCOUNTER — Encounter (HOSPITAL_COMMUNITY): Payer: Self-pay | Admitting: Internal Medicine

## 2021-06-22 ENCOUNTER — Other Ambulatory Visit: Payer: Self-pay

## 2021-06-22 HISTORY — PX: CHOLECYSTECTOMY: SHX55

## 2021-06-22 SURGERY — LAPAROSCOPIC CHOLECYSTECTOMY
Anesthesia: General | Site: Abdomen

## 2021-06-22 MED ORDER — MIDAZOLAM HCL 2 MG/2ML IJ SOLN
INTRAMUSCULAR | Status: AC
  Filled 2021-06-22: qty 2

## 2021-06-22 MED ORDER — CHLORHEXIDINE GLUCONATE 0.12 % MT SOLN
OROMUCOSAL | Status: AC
  Filled 2021-06-22: qty 15

## 2021-06-22 MED ORDER — BUPIVACAINE HCL (PF) 0.5 % IJ SOLN
INTRAMUSCULAR | Status: DC | PRN
  Administered 2021-06-22: 20 mL

## 2021-06-22 MED ORDER — LACTATED RINGERS IV SOLN: INTRAVENOUS | Status: DC | PRN

## 2021-06-22 MED ORDER — ONDANSETRON HCL 4 MG/2ML IJ SOLN
INTRAMUSCULAR | Status: AC
  Filled 2021-06-22: qty 2

## 2021-06-22 MED ORDER — PROPOFOL 10 MG/ML IV BOLUS
INTRAVENOUS | Status: DC | PRN
  Administered 2021-06-22: 200 mg via INTRAVENOUS

## 2021-06-22 MED ORDER — LIDOCAINE HCL (PF) 2 % IJ SOLN
INTRAMUSCULAR | Status: AC
  Filled 2021-06-22: qty 15

## 2021-06-22 MED ORDER — SUCCINYLCHOLINE CHLORIDE 200 MG/10ML IV SOSY
PREFILLED_SYRINGE | INTRAVENOUS | Status: DC | PRN
  Administered 2021-06-22: 180 mg via INTRAVENOUS

## 2021-06-22 MED ORDER — BUPIVACAINE HCL (PF) 0.5 % IJ SOLN
INTRAMUSCULAR | Status: AC
  Filled 2021-06-22: qty 30

## 2021-06-22 MED ORDER — SUGAMMADEX SODIUM 500 MG/5ML IV SOLN
INTRAVENOUS | Status: DC | PRN
  Administered 2021-06-22: 200 mg via INTRAVENOUS

## 2021-06-22 MED ORDER — MIDAZOLAM HCL 2 MG/2ML IJ SOLN
INTRAMUSCULAR | Status: DC | PRN
  Administered 2021-06-22: 2 mg via INTRAVENOUS

## 2021-06-22 MED ORDER — SODIUM CHLORIDE 0.9 % IR SOLN
Status: DC | PRN
  Administered 2021-06-22: 1000 mL

## 2021-06-22 MED ORDER — CHLORHEXIDINE GLUCONATE 0.12 % MT SOLN: 15.0000 mL | Freq: Once | OROMUCOSAL | Status: DC

## 2021-06-22 MED ORDER — FENTANYL CITRATE (PF) 100 MCG/2ML IJ SOLN
INTRAMUSCULAR | Status: AC
  Filled 2021-06-22: qty 2

## 2021-06-22 MED ORDER — SUCCINYLCHOLINE CHLORIDE 200 MG/10ML IV SOSY
PREFILLED_SYRINGE | INTRAVENOUS | Status: AC
  Filled 2021-06-22: qty 10

## 2021-06-22 MED ORDER — DICYCLOMINE HCL 10 MG/ML IM SOLN
20.0000 mg | Freq: Once | INTRAMUSCULAR | Status: AC
  Administered 2021-06-22: 20 mg via INTRAMUSCULAR
  Filled 2021-06-22: qty 2

## 2021-06-22 MED ORDER — ORAL CARE MOUTH RINSE: 15.0000 mL | Freq: Once | OROMUCOSAL | Status: DC

## 2021-06-22 MED ORDER — KETOROLAC TROMETHAMINE 30 MG/ML IJ SOLN
INTRAMUSCULAR | Status: AC
  Filled 2021-06-22: qty 1

## 2021-06-22 MED ORDER — DEXAMETHASONE SODIUM PHOSPHATE 10 MG/ML IJ SOLN
INTRAMUSCULAR | Status: DC | PRN
  Administered 2021-06-22: 10 mg via INTRAVENOUS

## 2021-06-22 MED ORDER — HEMOSTATIC AGENTS (NO CHARGE) OPTIME
TOPICAL | Status: DC | PRN
  Administered 2021-06-22: 1 via TOPICAL

## 2021-06-22 MED ORDER — PROPOFOL 10 MG/ML IV BOLUS
INTRAVENOUS | Status: AC
  Filled 2021-06-22: qty 20

## 2021-06-22 MED ORDER — ROCURONIUM BROMIDE 10 MG/ML (PF) SYRINGE
PREFILLED_SYRINGE | INTRAVENOUS | Status: AC
  Filled 2021-06-22: qty 10

## 2021-06-22 MED ORDER — LACTATED RINGERS IV SOLN: INTRAVENOUS | Status: DC

## 2021-06-22 MED ORDER — ROCURONIUM BROMIDE 100 MG/10ML IV SOLN
INTRAVENOUS | Status: DC | PRN
  Administered 2021-06-22: 50 mg via INTRAVENOUS

## 2021-06-22 MED ORDER — DEXAMETHASONE SODIUM PHOSPHATE 10 MG/ML IJ SOLN
INTRAMUSCULAR | Status: AC
  Filled 2021-06-22: qty 1

## 2021-06-22 MED ORDER — LIDOCAINE HCL (CARDIAC) PF 50 MG/5ML IV SOSY
PREFILLED_SYRINGE | INTRAVENOUS | Status: DC | PRN
  Administered 2021-06-22: 100 mg via INTRAVENOUS

## 2021-06-22 MED ORDER — KETOROLAC TROMETHAMINE 30 MG/ML IJ SOLN
INTRAMUSCULAR | Status: DC | PRN
  Administered 2021-06-22: 30 mg via INTRAVENOUS

## 2021-06-22 MED ORDER — FENTANYL CITRATE PF 50 MCG/ML IJ SOSY
25.0000 ug | PREFILLED_SYRINGE | INTRAMUSCULAR | Status: DC | PRN
  Administered 2021-06-22: 50 ug via INTRAVENOUS
  Filled 2021-06-22: qty 1

## 2021-06-22 MED ORDER — ONDANSETRON HCL 4 MG/2ML IJ SOLN: 4.0000 mg | Freq: Once | INTRAMUSCULAR | Status: DC | PRN

## 2021-06-22 MED ORDER — FENTANYL CITRATE (PF) 250 MCG/5ML IJ SOLN
INTRAMUSCULAR | Status: AC
  Filled 2021-06-22: qty 5

## 2021-06-22 MED ORDER — FENTANYL CITRATE (PF) 100 MCG/2ML IJ SOLN
INTRAMUSCULAR | Status: DC | PRN
  Administered 2021-06-22 (×4): 50 ug via INTRAVENOUS
  Administered 2021-06-22: 150 ug via INTRAVENOUS

## 2021-06-22 MED ORDER — ONDANSETRON HCL 4 MG/2ML IJ SOLN
INTRAMUSCULAR | Status: DC | PRN
  Administered 2021-06-22: 4 mg via INTRAVENOUS

## 2021-06-22 SURGICAL SUPPLY — 41 items
ADH SKN CLS APL DERMABOND .7 (GAUZE/BANDAGES/DRESSINGS) ×1
APL PRP STRL LF DISP 70% ISPRP (MISCELLANEOUS) ×1
APPLIER CLIP ROT 10 11.4 M/L (STAPLE) ×2
APR CLP MED LRG 11.4X10 (STAPLE) ×1
BAG RETRIEVAL 10 (BASKET) ×1
BLADE SURG 15 STRL LF DISP TIS (BLADE) ×1 IMPLANT
BLADE SURG 15 STRL SS (BLADE) ×2
CHLORAPREP W/TINT 26 (MISCELLANEOUS) ×2 IMPLANT
CLIP APPLIE ROT 10 11.4 M/L (STAPLE) ×1 IMPLANT
CLOTH BEACON ORANGE TIMEOUT ST (SAFETY) ×2 IMPLANT
COVER LIGHT HANDLE STERIS (MISCELLANEOUS) ×4 IMPLANT
DECANTER SPIKE VIAL GLASS SM (MISCELLANEOUS) ×2 IMPLANT
DERMABOND ADVANCED (GAUZE/BANDAGES/DRESSINGS) ×1
DERMABOND ADVANCED .7 DNX12 (GAUZE/BANDAGES/DRESSINGS) ×1 IMPLANT
ELECT REM PT RETURN 9FT ADLT (ELECTROSURGICAL) ×2
ELECTRODE REM PT RTRN 9FT ADLT (ELECTROSURGICAL) ×1 IMPLANT
GLOVE SURG ENC MOIS LTX SZ6.5 (GLOVE) ×2 IMPLANT
GLOVE SURG UNDER POLY LF SZ6.5 (GLOVE) ×2 IMPLANT
GLOVE SURG UNDER POLY LF SZ7 (GLOVE) ×6 IMPLANT
GOWN STRL REUS W/TWL LRG LVL3 (GOWN DISPOSABLE) ×6 IMPLANT
HEMOSTAT SNOW SURGICEL 2X4 (HEMOSTASIS) ×2 IMPLANT
INST SET LAPROSCOPIC AP (KITS) ×2 IMPLANT
KIT TURNOVER KIT A (KITS) ×2 IMPLANT
MANIFOLD NEPTUNE II (INSTRUMENTS) ×2 IMPLANT
NDL INSUFFLATION 14GA 120MM (NEEDLE) ×1 IMPLANT
NEEDLE INSUFFLATION 14GA 120MM (NEEDLE) ×2 IMPLANT
NS IRRIG 1000ML POUR BTL (IV SOLUTION) ×2 IMPLANT
PACK LAP CHOLE LZT030E (CUSTOM PROCEDURE TRAY) ×2 IMPLANT
PAD ARMBOARD 7.5X6 YLW CONV (MISCELLANEOUS) ×2 IMPLANT
SET BASIN LINEN APH (SET/KITS/TRAYS/PACK) ×2 IMPLANT
SET TUBE SMOKE EVAC HIGH FLOW (TUBING) ×2 IMPLANT
SLEEVE ENDOPATH XCEL 5M (ENDOMECHANICALS) ×2 IMPLANT
SUT MNCRL AB 4-0 PS2 18 (SUTURE) ×4 IMPLANT
SUT VICRYL 0 UR6 27IN ABS (SUTURE) ×2 IMPLANT
SYS BAG RETRIEVAL 10MM (BASKET) ×1
SYSTEM BAG RETRIEVAL 10MM (BASKET) ×1 IMPLANT
TROCAR ENDO BLADELESS 11MM (ENDOMECHANICALS) ×2 IMPLANT
TROCAR XCEL NON-BLD 5MMX100MML (ENDOMECHANICALS) ×2 IMPLANT
TROCAR XCEL UNIV SLVE 11M 100M (ENDOMECHANICALS) ×2 IMPLANT
TUBE CONNECTING 12X1/4 (SUCTIONS) ×2 IMPLANT
WARMER LAPAROSCOPE (MISCELLANEOUS) ×2 IMPLANT

## 2021-06-22 NOTE — Plan of Care (Signed)
  Problem: Health Behavior/Discharge Planning: Goal: Ability to manage health-related needs will improve Outcome: Progressing   Problem: Clinical Measurements: Goal: Ability to maintain clinical measurements within normal limits will improve Outcome: Progressing Goal: Will remain free from infection Outcome: Progressing Goal: Diagnostic test results will improve Outcome: Progressing Goal: Respiratory complications will improve Outcome: Progressing   Problem: Activity: Goal: Risk for activity intolerance will decrease Outcome: Progressing   Problem: Nutrition: Goal: Adequate nutrition will be maintained Outcome: Progressing   Problem: Coping: Goal: Level of anxiety will decrease Outcome: Progressing   Problem: Elimination: Goal: Will not experience complications related to bowel motility Outcome: Progressing Goal: Will not experience complications related to urinary retention Outcome: Progressing   Problem: Safety: Goal: Ability to remain free from injury will improve Outcome: Progressing   Problem: Skin Integrity: Goal: Risk for impaired skin integrity will decrease Outcome: Progressing   Problem: Education: Goal: Knowledge of General Education information will improve Description: Including pain rating scale, medication(s)/side effects and non-pharmacologic comfort measures Outcome: Progressing   Problem: Pain Managment: Goal: General experience of comfort will improve Outcome: Not Progressing

## 2021-06-22 NOTE — Anesthesia Postprocedure Evaluation (Signed)
Anesthesia Post Note  Patient: Amy Wang  Procedure(s) Performed: LAPAROSCOPIC CHOLECYSTECTOMY (Abdomen)  Patient location during evaluation: Phase II Anesthesia Type: General Level of consciousness: awake Pain management: pain level controlled Vital Signs Assessment: post-procedure vital signs reviewed and stable Respiratory status: spontaneous breathing and respiratory function stable Cardiovascular status: blood pressure returned to baseline and stable Postop Assessment: no headache and no apparent nausea or vomiting Anesthetic complications: no Comments: Late entry   No notable events documented.   Last Vitals:  Vitals:   06/22/21 1343 06/22/21 1514  BP: 121/62 124/82  Pulse: 80   Resp: 14   Temp: 36.4 C 36.6 C  SpO2: 99% 97%    Last Pain:  Vitals:   06/22/21 1514  TempSrc:   PainSc: [place]

## 2021-06-22 NOTE — Interval H&P Note (Signed)
History and Physical Interval Note:  06/22/2021 1:58 PM  Amy Wang  has presented today for surgery, with the diagnosis of acute cholecystitis.  The various methods of treatment have been discussed with the patient and family. After consideration of risks, benefits and other options for treatment, the patient has consented to  Procedure(s): LAPAROSCOPIC CHOLECYSTECTOMY (N/A) as a surgical intervention.  The patient's history has been reviewed, patient examined, no change in status, stable for surgery.  I have reviewed the patient's chart and labs.  Questions were answered to the patient's satisfaction.     Virl Cagey

## 2021-06-22 NOTE — Anesthesia Preprocedure Evaluation (Signed)
Anesthesia Evaluation  Patient identified by MRN, date of birth, ID band Patient awake    Reviewed: Allergy & Precautions, H&P , NPO status , Patient's Chart, lab work & pertinent test results, reviewed documented beta blocker date and time   Airway Mallampati: II  TM Distance: >3 FB Neck ROM: full    Dental no notable dental hx.    Pulmonary neg pulmonary ROS,    Pulmonary exam normal breath sounds clear to auscultation       Cardiovascular Exercise Tolerance: Good negative cardio ROS   Rhythm:regular Rate:Normal     Neuro/Psych PSYCHIATRIC DISORDERS Anxiety negative neurological ROS     GI/Hepatic Neg liver ROS, GERD  Medicated,  Endo/Other  Hypothyroidism   Renal/GU negative Renal ROS  negative genitourinary   Musculoskeletal   Abdominal   Peds  Hematology negative hematology ROS (+)   Anesthesia Other Findings   Reproductive/Obstetrics negative OB ROS                             Anesthesia Physical Anesthesia Plan  ASA: 2  Anesthesia Plan: General and General ETT   Post-op Pain Management:    Induction:   PONV Risk Score and Plan:   Airway Management Planned:   Additional Equipment:   Intra-op Plan:   Post-operative Plan:   Informed Consent: I have reviewed the patients History and Physical, chart, labs and discussed the procedure including the risks, benefits and alternatives for the proposed anesthesia with the patient or authorized representative who has indicated his/her understanding and acceptance.     Dental Advisory Given  Plan Discussed with: CRNA  Anesthesia Plan Comments:         Anesthesia Quick Evaluation

## 2021-06-22 NOTE — Op Note (Signed)
Operative Note   Preoperative Diagnosis: Acute cholecystitis    Postoperative Diagnosis: Same   Procedure(s) Performed: Laparoscopic cholecystectomy   Surgeon: Ria Comment C. Constance Haw, MD   Assistants: Aviva Signs, MD    Anesthesia: General endotracheal   Anesthesiologist: Louann Sjogren, MD    Specimens: Gallbladder    Estimated Blood Loss: Minimal    Blood Replacement: None    Complications: None    Operative Findings: Gallbladder with edema and distention    Procedure: The patient was taken to the operating room and placed supine. General endotracheal anesthesia was induced. Intravenous antibiotics were administered per protocol. An orogastric tube positioned to decompress the stomach. The abdomen was prepared and draped in the usual sterile fashion.    A supraumbilical incision was made and a Veress technique was utilized to achieve pneumoperitoneum to 15 mmHg with carbon dioxide. A 11 mm optiview port was placed through the supraumbilical region, and a 10 mm 0-degree operative laparoscope was introduced. The area underlying the trocar and Veress needle were inspected and without evidence of injury.  Remaining trocars were placed under direct vision. Two 5 mm ports were placed in the right abdomen, between the anterior axillary and midclavicular line.  A final 11 mm port was placed through the mid-epigastrium, near the falciform ligament.    The gallbladder fundus was elevated cephalad and the infundibulum was retracted to the patient's right. The gallbladder/cystic duct junction was skeletonized. The cystic artery noted in the triangle of Calot and was also skeletonized.  We then continued liberal medial and lateral dissection until the critical view of safety was achieved.    The cystic duct was triply clipped and cystic artery was doubly clipped and both were divided. The gallbladder was then dissected from the liver bed with electrocautery. The specimen was placed in an Endopouch  and was retrieved through the epigastric site.   Final inspection revealed acceptable hemostasis. Surgical SNOW was placed in the gallbladder bed.  Trocars were removed and pneumoperitoneum was released.  The epigastric and umbilical port sites were smaller than my fingertip. Skin incisions were closed with 4-0 Monocryl subcuticular sutures and Dermabond. The patient was awakened from anesthesia and extubated without complication.    Curlene Labrum, MD [facility] [address] [place], [place] [address] [phone] (office)

## 2021-06-22 NOTE — Transfer of Care (Signed)
Immediate Anesthesia Transfer of Care Note  Patient: Amy Wang  Procedure(s) Performed: LAPAROSCOPIC CHOLECYSTECTOMY (Abdomen)  Patient Location: PACU  Anesthesia Type:General  Level of Consciousness: awake and alert   Airway & Oxygen Therapy: Patient Spontanous Breathing  Post-op Assessment: Report given to RN and Post -op Vital signs reviewed and stable  Post vital signs: Reviewed and stable  Last Vitals:  Vitals Value Taken Time  BP    Temp 97.8   Pulse    Resp    SpO2    SEE VITAL SIGN FLOW SHEET  Last Pain:  Vitals:   06/22/21 1343  TempSrc: Oral  PainSc: 8       Patients Stated Pain Goal: 0 ([id number])  Complications: No notable events documented.

## 2021-06-22 NOTE — Progress Notes (Signed)
[facility]  Surgery completed. Given pain control issues with baseline chronic pain will keep overnight and ensure able to tolerate diet and pain at reasonable level.  Home MS contin, Percocet 10 QID ordered PRN dilaudid  Curlene Labrum, MD [facility] [address] [place], [place] [address] [phone] (office)

## 2021-06-22 NOTE — Anesthesia Procedure Notes (Signed)
Procedure Name: Intubation Date/Time: 06/22/2021 2:15 PM Performed by: Tacy Learn, CRNA Pre-anesthesia Checklist: Patient identified, Emergency Drugs available, Suction available, Patient being monitored and Timeout performed Patient Re-evaluated:Patient Re-evaluated prior to induction Oxygen Delivery Method: Circle system utilized Preoxygenation: Pre-oxygenation with 100% oxygen Induction Type: IV induction Laryngoscope Size: Miller and 2 Grade View: Grade I Tube type: Oral Tube size: 7.0 mm Number of attempts: 1 Airway Equipment and Method: Stylet Placement Confirmation: ETT inserted through vocal cords under direct vision, positive ETCO2, CO2 detector and breath sounds checked- equal and bilateral Secured at: 21 cm Tube secured with: Tape Dental Injury: Teeth and Oropharynx as per pre-operative assessment

## 2021-06-22 NOTE — Discharge Instructions (Addendum)
Discharge Laparoscopic Surgery Instructions:  Common Complaints: Right shoulder pain is common after laparoscopic surgery. This is secondary to the gas used in the surgery being trapped under the diaphragm.  Walk to help your body absorb the gas. This will improve in a few days. Pain at the port sites are common, especially the larger port sites. This will improve with time.  Some nausea is common and poor appetite. The main goal is to stay hydrated the first few days after surgery.   Diet/ Activity: Diet as tolerated. You may not have an appetite, but it is important to stay hydrated. Drink 64 ounces of water a day. Your appetite will return with time.  Shower per your regular routine daily.  Do not take hot showers. Take warm showers that are less than 10 minutes. Rest and listen to your body, but do not remain in bed all day.  Walk everyday for at least 15-20 minutes. Deep cough and move around every 1-2 hours in the first few days after surgery.  Do not lift > 10 lbs, perform excessive bending, pushing, pulling, squatting for 1-2 weeks after surgery.  Do not pick at the dermabond glue on your incision sites.  This glue film will remain in place for 1-2 weeks and will start to peel off.  Do not place lotions or balms on your incision unless instructed to specifically by Dr. Constance Haw.   Pain Expectations and Narcotics: -After surgery you will have pain associated with your incisions and this is normal. The pain is muscular and nerve pain, and will get better with time. -You are encouraged and expected to take non narcotic medications like tylenol and ibuprofen (when able) to treat pain as multiple modalities can aid with pain treatment. -Narcotics are only used when pain is severe or there is breakthrough pain. -You are not expected to have a pain score of 0 after surgery, as we cannot prevent pain. A pain score of 3-4 that allows you to be functional, move, walk, and tolerate some activity is  the goal. The pain will continue to improve over the days after surgery and is dependent on your surgery. -Due to [place] law, we are only able to give a certain amount of pain medication to treat post operative pain, and we only give additional narcotics on a patient by patient basis.  -For most laparoscopic surgery, studies have shown that the majority of patients only need 10-15 narcotic pills, and for open surgeries most patients only need 15-20.   -Having appropriate expectations of pain and knowledge of pain management with non narcotics is important as we do not want anyone to become addicted to narcotic pain medication.  -Using ice packs in the first 48 hours and heating pads after 48 hours, wearing an abdominal binder (when recommended), and using over the counter medications are all ways to help with pain management.   -Simple acts like meditation and mindfulness practices after surgery can also help with pain control and research has proven the benefit of these practices.  Medication: Take tylenol and ibuprofen as needed for pain control, alternating every 4-6 hours.  Example:  Tylenol '1000mg'$  @ 6am, 12noon, 6pm, 75mdnight (Do not exceed '4000mg'$  of tylenol a day). Ibuprofen '800mg'$  @ 9am, 3pm, 9pm, 3am (Do not exceed '3600mg'$  of ibuprofen a day).  Take your regular pain medication. Will notify [facility] of additional pain needs.  Take Roxicodone for breakthrough pain every 4 hours.  Take Colace for constipation related to narcotic pain  medication. If you do not have a bowel movement in 2 days, take Miralax over the counter.  Drink plenty of water to also prevent constipation.   Contact Information: If you have questions or concerns, please call our office, [phone], Monday- Thursday 8AM-5PM and Friday 8AM-12Noon.  If it is after hours or on the weekend, please call [facility] Main Number, [phone], [phone], and ask to speak to the surgeon on call for Dr. Constance Haw at [facility].

## 2021-06-23 ENCOUNTER — Encounter (HOSPITAL_COMMUNITY): Payer: Self-pay | Admitting: General Surgery

## 2021-06-23 MED ORDER — OXYCODONE HCL 5 MG PO TABS: 5.0000 mg | ORAL_TABLET | ORAL | 0 refills | Status: AC | PRN

## 2021-06-23 MED ORDER — ONDANSETRON HCL 4 MG PO TABS: 4.0000 mg | ORAL_TABLET | Freq: Four times a day (QID) | ORAL | 0 refills | Status: AC | PRN

## 2021-06-23 NOTE — Discharge Summary (Signed)
Physician Discharge Summary  Patient ID: Amy Wang MRN: [id number] DOB/AGE: [birth year] 42 y.o.  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 She was brought in over the weekend for IV antibiotics and plans for surgery on Monday. Monday her gallbladder was removed and given her history with pain, she was kept overnight to ensure that she was able to have pain control and tolerate a diet.  She was having adequate pain control with her home percocet and needed some breakthrough pain control. I notified her NP regarding need for roxicodone 5mg  q4 PRN and that the patient was planning to hold her MS Contin.    The patient felt better and was sore from the lateral most port site.   Consults:  Hospitalist admission, taken over by surgery   Significant Diagnostic Studies: [place] with cholecystitis   Treatments: IV antibiotics, hydration, laparoscopic cholecystectomy  Discharge Exam: Blood pressure 121/66, pulse 72, temperature 98.3 F (36.8 C), resp. rate 18, height 5\' 10"  (1.778 m), weight 118.5 kg, last menstrual period 09/20/2015, SpO2 95 %. General appearance: alert, cooperative, and no distress Resp: normal work of breathing GI: soft, nondistended, appropriately tender, dermabond in place  Disposition: Discharge disposition: 01-Home or Self Care      Discharge Instructions     Call MD for:  difficulty breathing, headache or visual disturbances   Complete by: As directed    Call MD for:  extreme fatigue   Complete by: As directed    Call MD for:  persistant dizziness or light-headedness   Complete by: As directed     Call MD for:  persistant nausea and vomiting   Complete by: As directed    Call MD for:  redness, tenderness, or signs of infection (pain, swelling, redness, odor or green/yellow discharge around incision site)   Complete by: As directed    Call MD for:  severe uncontrolled pain   Complete by: As directed    Call MD for:  temperature >100.4   Complete by: As directed    Increase activity slowly   Complete by: As directed       Allergies as of 06/23/2021       Reactions   Scopolamine Nausea And Vomiting   TRAMSDERM        Medication List     STOP taking these medications    ciprofloxacin 500 MG tablet Commonly known as: CIPRO       TAKE these medications    ALPRAZolam 1 MG tablet Commonly known as: XANAX Take 1 mg by mouth 3 (three) times daily as needed for anxiety.   amphetamine-dextroamphetamine 30 MG tablet Commonly known as: ADDERALL Take 30 mg by mouth daily.   Carbamazepine 100 MG Cp12 12 hr capsule Commonly known as: EQUETRO Take by mouth.   clonazePAM 1 MG tablet Commonly known as: KLONOPIN Take 1 mg by mouth 3 (three) times daily.   DULoxetine 30 MG capsule Commonly known as: CYMBALTA Take 90 mg by mouth every morning.   fenofibrate micronized 67 MG capsule Commonly known as: LOFIBRA Take 67 mg by mouth daily.   FLUoxetine 20 MG capsule Commonly known as: PROZAC TAKE 3 CAPSULES BY  MOUTH ONCE DAILY.   Latuda 40 MG Tabs tablet Generic drug: lurasidone Take 1 tablet by mouth daily.   levothyroxine 75 MCG tablet Commonly known as: SYNTHROID Take 1 tablet (75 mcg total) by mouth daily.   metoprolol tartrate 50 MG tablet Commonly known as: LOPRESSOR Take 50 mg by mouth daily.   morphine 30 MG 12 hr tablet Commonly known as: MS CONTIN Take 30 mg by mouth every 12 (twelve) hours.   ondansetron 4 MG tablet Commonly known as: ZOFRAN Take 1 tablet (4 mg total) by mouth every 6 (six) hours as needed for nausea. What changed:  when to  take this reasons to take this   oxyCODONE 5 MG immediate release tablet Commonly known as: Oxy IR/ROXICODONE Take 1 tablet (5 mg total) by mouth every 4 (four) hours as needed for severe pain.   oxyCODONE-acetaminophen 7.5-325 MG tablet Commonly known as: PERCOCET Take 1 tablet by mouth every 6 (six) hours as needed for moderate pain.   pregabalin 200 MG capsule Commonly known as: LYRICA TAKE 1 CAPSULE BY MOUTH THREE TIMES DAILY.   traZODone 50 MG tablet Commonly known as: DESYREL Take 50-150 mg by mouth at bedtime.        Follow-up Information     Virl Cagey, MD Follow up on 07/07/2021.   Specialty: General Surgery Why: post op cholecystectomy phone call; if you need to be seen in person call the office Contact information: [address] [place]na [place] [facility] [address] [phone]                 Signed: Virl Cagey 06/23/2021, 5:19 PM

## 2021-06-23 NOTE — Progress Notes (Signed)
Discharge instructions given to patient. Patient verbalized understanding. Discharged patient via wheelchair by private vehicle.

## 2021-06-24 LAB — CULTURE, BLOOD (ROUTINE X 2)
Culture: NO GROWTH
Culture: NO GROWTH
Special Requests: ADEQUATE

## 2021-06-24 LAB — SURGICAL PATHOLOGY

## 2021-07-06 DIAGNOSIS — I10 Essential (primary) hypertension: Secondary | ICD-10-CM | POA: Diagnosis not present

## 2021-07-06 DIAGNOSIS — E039 Hypothyroidism, unspecified: Secondary | ICD-10-CM | POA: Diagnosis not present

## 2021-07-06 DIAGNOSIS — R946 Abnormal results of thyroid function studies: Secondary | ICD-10-CM | POA: Diagnosis not present

## 2021-07-07 ENCOUNTER — Ambulatory Visit (INDEPENDENT_AMBULATORY_CARE_PROVIDER_SITE_OTHER): Payer: BC Managed Care – PPO | Admitting: General Surgery

## 2021-07-07 DIAGNOSIS — K810 Acute cholecystitis: Secondary | ICD-10-CM

## 2021-07-07 NOTE — Progress Notes (Signed)
[facility]  I am calling the patient for post operative evaluation. This is not a billable encounter as it is under the [employer] charges for the surgery.  The patient had a laparoscopic cholecystectomy on 06/22/2021. The patient reports that she is doing well and having no issues. The are tolerating a diet, having good pain control, and having regular Bms.  The incisions are healing. The patient has no concerns.   Pathology: FINAL MICROSCOPIC DIAGNOSIS:   A. GALLBLADDER, CHOLECYSTECTOMY:  - Chronic cholecystitis with cholelithiasis.   Will see the patient PRN.   Curlene Labrum, MD [facility] [address] [place], [place] [address] [phone] (office)

## 2021-07-15 DIAGNOSIS — E039 Hypothyroidism, unspecified: Secondary | ICD-10-CM | POA: Diagnosis not present

## 2021-07-15 DIAGNOSIS — E782 Mixed hyperlipidemia: Secondary | ICD-10-CM | POA: Diagnosis not present

## 2021-07-15 DIAGNOSIS — F329 Major depressive disorder, single episode, unspecified: Secondary | ICD-10-CM | POA: Diagnosis not present

## 2021-07-15 DIAGNOSIS — Z8249 Family history of ischemic heart disease and other diseases of the circulatory system: Secondary | ICD-10-CM | POA: Diagnosis not present

## 2021-07-27 DIAGNOSIS — F3342 Major depressive disorder, recurrent, in full remission: Secondary | ICD-10-CM | POA: Diagnosis not present

## 2021-07-27 DIAGNOSIS — F900 Attention-deficit hyperactivity disorder, predominantly inattentive type: Secondary | ICD-10-CM | POA: Diagnosis not present

## 2021-07-27 DIAGNOSIS — F410 Panic disorder [episodic paroxysmal anxiety] without agoraphobia: Secondary | ICD-10-CM | POA: Diagnosis not present

## 2021-08-17 DIAGNOSIS — G609 Hereditary and idiopathic neuropathy, unspecified: Secondary | ICD-10-CM | POA: Diagnosis not present

## 2021-08-17 DIAGNOSIS — M545 Low back pain, unspecified: Secondary | ICD-10-CM | POA: Diagnosis not present

## 2021-08-17 DIAGNOSIS — G4700 Insomnia, unspecified: Secondary | ICD-10-CM | POA: Diagnosis not present

## 2021-08-17 DIAGNOSIS — M79606 Pain in leg, unspecified: Secondary | ICD-10-CM | POA: Diagnosis not present

## 2021-08-17 DIAGNOSIS — Z79891 Long term (current) use of opiate analgesic: Secondary | ICD-10-CM | POA: Diagnosis not present

## 2021-08-17 DIAGNOSIS — M79603 Pain in arm, unspecified: Secondary | ICD-10-CM | POA: Diagnosis not present

## 2021-08-17 DIAGNOSIS — M5416 Radiculopathy, lumbar region: Secondary | ICD-10-CM | POA: Diagnosis not present

## 2021-08-17 DIAGNOSIS — F341 Dysthymic disorder: Secondary | ICD-10-CM | POA: Diagnosis not present

## 2021-08-17 DIAGNOSIS — R2689 Other abnormalities of gait and mobility: Secondary | ICD-10-CM | POA: Diagnosis not present

## 2021-10-16 DIAGNOSIS — M79606 Pain in leg, unspecified: Secondary | ICD-10-CM | POA: Diagnosis not present

## 2021-10-16 DIAGNOSIS — M545 Low back pain, unspecified: Secondary | ICD-10-CM | POA: Diagnosis not present

## 2021-10-16 DIAGNOSIS — Z79891 Long term (current) use of opiate analgesic: Secondary | ICD-10-CM | POA: Diagnosis not present

## 2022-01-07 DIAGNOSIS — R2689 Other abnormalities of gait and mobility: Secondary | ICD-10-CM | POA: Diagnosis not present

## 2022-01-07 DIAGNOSIS — M545 Low back pain, unspecified: Secondary | ICD-10-CM | POA: Diagnosis not present

## 2022-01-07 DIAGNOSIS — Z79891 Long term (current) use of opiate analgesic: Secondary | ICD-10-CM | POA: Diagnosis not present

## 2022-01-07 DIAGNOSIS — M79603 Pain in arm, unspecified: Secondary | ICD-10-CM | POA: Diagnosis not present

## 2022-01-07 DIAGNOSIS — M79606 Pain in leg, unspecified: Secondary | ICD-10-CM | POA: Diagnosis not present

## 2022-01-11 DIAGNOSIS — I10 Essential (primary) hypertension: Secondary | ICD-10-CM | POA: Diagnosis not present

## 2022-01-11 DIAGNOSIS — Z139 Encounter for screening, unspecified: Secondary | ICD-10-CM | POA: Diagnosis not present

## 2022-01-11 DIAGNOSIS — E039 Hypothyroidism, unspecified: Secondary | ICD-10-CM | POA: Diagnosis not present

## 2022-01-13 DIAGNOSIS — Z0001 Encounter for general adult medical examination with abnormal findings: Secondary | ICD-10-CM | POA: Diagnosis not present

## 2022-02-01 DIAGNOSIS — H52222 Regular astigmatism, left eye: Secondary | ICD-10-CM | POA: Diagnosis not present

## 2022-02-01 DIAGNOSIS — H5203 Hypermetropia, bilateral: Secondary | ICD-10-CM | POA: Diagnosis not present

## 2022-02-01 DIAGNOSIS — H524 Presbyopia: Secondary | ICD-10-CM | POA: Diagnosis not present

## 2022-03-30 DIAGNOSIS — Z79891 Long term (current) use of opiate analgesic: Secondary | ICD-10-CM | POA: Diagnosis not present

## 2022-03-30 DIAGNOSIS — M79603 Pain in arm, unspecified: Secondary | ICD-10-CM | POA: Diagnosis not present

## 2022-03-30 DIAGNOSIS — M545 Low back pain, unspecified: Secondary | ICD-10-CM | POA: Diagnosis not present

## 2022-03-30 DIAGNOSIS — M79606 Pain in leg, unspecified: Secondary | ICD-10-CM | POA: Diagnosis not present

## 2022-03-30 DIAGNOSIS — R2689 Other abnormalities of gait and mobility: Secondary | ICD-10-CM | POA: Diagnosis not present

## 2022-04-20 DIAGNOSIS — E6609 Other obesity due to excess calories: Secondary | ICD-10-CM | POA: Diagnosis not present

## 2022-04-20 DIAGNOSIS — Z634 Disappearance and death of family member: Secondary | ICD-10-CM | POA: Diagnosis not present

## 2022-04-20 DIAGNOSIS — Z01419 Encounter for gynecological examination (general) (routine) without abnormal findings: Secondary | ICD-10-CM | POA: Diagnosis not present

## 2022-04-20 DIAGNOSIS — E669 Obesity, unspecified: Secondary | ICD-10-CM | POA: Diagnosis not present

## 2022-06-03 DIAGNOSIS — Z1231 Encounter for screening mammogram for malignant neoplasm of breast: Secondary | ICD-10-CM | POA: Diagnosis not present

## 2022-06-22 DIAGNOSIS — M545 Low back pain, unspecified: Secondary | ICD-10-CM | POA: Diagnosis not present

## 2022-06-22 DIAGNOSIS — M79603 Pain in arm, unspecified: Secondary | ICD-10-CM | POA: Diagnosis not present

## 2022-06-22 DIAGNOSIS — M79606 Pain in leg, unspecified: Secondary | ICD-10-CM | POA: Diagnosis not present

## 2022-06-22 DIAGNOSIS — Z79891 Long term (current) use of opiate analgesic: Secondary | ICD-10-CM | POA: Diagnosis not present

## 2022-07-12 ENCOUNTER — Other Ambulatory Visit: Payer: Self-pay

## 2022-07-26 DIAGNOSIS — E039 Hypothyroidism, unspecified: Secondary | ICD-10-CM | POA: Diagnosis not present

## 2022-07-26 DIAGNOSIS — I10 Essential (primary) hypertension: Secondary | ICD-10-CM | POA: Diagnosis not present

## 2022-07-26 DIAGNOSIS — E785 Hyperlipidemia, unspecified: Secondary | ICD-10-CM | POA: Diagnosis not present

## 2022-07-30 DIAGNOSIS — F329 Major depressive disorder, single episode, unspecified: Secondary | ICD-10-CM | POA: Diagnosis not present

## 2022-07-30 DIAGNOSIS — E039 Hypothyroidism, unspecified: Secondary | ICD-10-CM | POA: Diagnosis not present

## 2022-07-30 DIAGNOSIS — E782 Mixed hyperlipidemia: Secondary | ICD-10-CM | POA: Diagnosis not present

## 2022-07-30 DIAGNOSIS — Z8249 Family history of ischemic heart disease and other diseases of the circulatory system: Secondary | ICD-10-CM | POA: Diagnosis not present

## 2022-09-14 DIAGNOSIS — M79603 Pain in arm, unspecified: Secondary | ICD-10-CM | POA: Diagnosis not present

## 2022-09-14 DIAGNOSIS — G603 Idiopathic progressive neuropathy: Secondary | ICD-10-CM | POA: Diagnosis not present

## 2022-09-14 DIAGNOSIS — M79606 Pain in leg, unspecified: Secondary | ICD-10-CM | POA: Diagnosis not present

## 2022-09-14 DIAGNOSIS — M545 Low back pain, unspecified: Secondary | ICD-10-CM | POA: Diagnosis not present

## 2022-11-17 DIAGNOSIS — I10 Essential (primary) hypertension: Secondary | ICD-10-CM | POA: Diagnosis not present

## 2022-11-17 DIAGNOSIS — E039 Hypothyroidism, unspecified: Secondary | ICD-10-CM | POA: Diagnosis not present

## 2022-11-17 DIAGNOSIS — E785 Hyperlipidemia, unspecified: Secondary | ICD-10-CM | POA: Diagnosis not present

## 2022-11-23 ENCOUNTER — Other Ambulatory Visit (HOSPITAL_COMMUNITY): Payer: Self-pay | Admitting: Family Medicine

## 2022-11-23 DIAGNOSIS — E782 Mixed hyperlipidemia: Secondary | ICD-10-CM | POA: Diagnosis not present

## 2022-11-23 DIAGNOSIS — Z8249 Family history of ischemic heart disease and other diseases of the circulatory system: Secondary | ICD-10-CM | POA: Diagnosis not present

## 2022-11-23 DIAGNOSIS — F329 Major depressive disorder, single episode, unspecified: Secondary | ICD-10-CM | POA: Diagnosis not present

## 2022-11-23 DIAGNOSIS — E039 Hypothyroidism, unspecified: Secondary | ICD-10-CM | POA: Diagnosis not present

## 2022-11-23 DIAGNOSIS — E669 Obesity, unspecified: Secondary | ICD-10-CM | POA: Diagnosis not present

## 2022-11-23 DIAGNOSIS — R7401 Elevation of levels of liver transaminase levels: Secondary | ICD-10-CM

## 2022-11-24 ENCOUNTER — Ambulatory Visit (HOSPITAL_COMMUNITY)
Admission: RE | Admit: 2022-11-24 | Discharge: 2022-11-24 | Disposition: A | Discharge status: Home / Self Care | Payer: BC Managed Care – PPO | Source: Ambulatory Visit | Attending: Family Medicine | Admitting: Family Medicine

## 2022-11-24 DIAGNOSIS — K760 Fatty (change of) liver, not elsewhere classified: Secondary | ICD-10-CM | POA: Diagnosis not present

## 2022-11-24 DIAGNOSIS — R7401 Elevation of levels of liver transaminase levels: Secondary | ICD-10-CM | POA: Insufficient documentation

## 2022-11-24 DIAGNOSIS — Z136 Encounter for screening for cardiovascular disorders: Secondary | ICD-10-CM | POA: Diagnosis not present

## 2022-11-24 DIAGNOSIS — R748 Abnormal levels of other serum enzymes: Secondary | ICD-10-CM | POA: Diagnosis not present

## 2022-11-30 DIAGNOSIS — Z79899 Other long term (current) drug therapy: Secondary | ICD-10-CM | POA: Diagnosis not present

## 2022-11-30 DIAGNOSIS — E559 Vitamin D deficiency, unspecified: Secondary | ICD-10-CM | POA: Diagnosis not present

## 2022-11-30 DIAGNOSIS — M129 Arthropathy, unspecified: Secondary | ICD-10-CM | POA: Diagnosis not present

## 2022-11-30 DIAGNOSIS — G629 Polyneuropathy, unspecified: Secondary | ICD-10-CM | POA: Diagnosis not present

## 2022-12-14 DIAGNOSIS — Z6834 Body mass index (BMI) 34.0-34.9, adult: Secondary | ICD-10-CM | POA: Diagnosis not present

## 2022-12-14 DIAGNOSIS — E790 Hyperuricemia without signs of inflammatory arthritis and tophaceous disease: Secondary | ICD-10-CM | POA: Diagnosis not present

## 2022-12-14 DIAGNOSIS — G894 Chronic pain syndrome: Secondary | ICD-10-CM | POA: Diagnosis not present

## 2022-12-14 DIAGNOSIS — Z79899 Other long term (current) drug therapy: Secondary | ICD-10-CM | POA: Diagnosis not present

## 2022-12-14 DIAGNOSIS — E559 Vitamin D deficiency, unspecified: Secondary | ICD-10-CM | POA: Diagnosis not present

## 2023-01-10 DIAGNOSIS — G629 Polyneuropathy, unspecified: Secondary | ICD-10-CM | POA: Diagnosis not present

## 2023-01-10 DIAGNOSIS — G894 Chronic pain syndrome: Secondary | ICD-10-CM | POA: Diagnosis not present

## 2023-01-10 DIAGNOSIS — Z79899 Other long term (current) drug therapy: Secondary | ICD-10-CM | POA: Diagnosis not present

## 2023-01-10 DIAGNOSIS — E559 Vitamin D deficiency, unspecified: Secondary | ICD-10-CM | POA: Diagnosis not present

## 2023-01-10 DIAGNOSIS — Z6833 Body mass index (BMI) 33.0-33.9, adult: Secondary | ICD-10-CM | POA: Diagnosis not present

## 2023-02-03 DIAGNOSIS — F900 Attention-deficit hyperactivity disorder, predominantly inattentive type: Secondary | ICD-10-CM | POA: Diagnosis not present

## 2023-02-03 DIAGNOSIS — F3174 Bipolar disorder, in full remission, most recent episode manic: Secondary | ICD-10-CM | POA: Diagnosis not present

## 2023-02-03 DIAGNOSIS — F410 Panic disorder [episodic paroxysmal anxiety] without agoraphobia: Secondary | ICD-10-CM | POA: Diagnosis not present

## 2023-02-03 DIAGNOSIS — F4321 Adjustment disorder with depressed mood: Secondary | ICD-10-CM | POA: Diagnosis not present

## 2023-02-08 DIAGNOSIS — G629 Polyneuropathy, unspecified: Secondary | ICD-10-CM | POA: Diagnosis not present

## 2023-02-08 DIAGNOSIS — Z79899 Other long term (current) drug therapy: Secondary | ICD-10-CM | POA: Diagnosis not present

## 2023-02-08 DIAGNOSIS — G894 Chronic pain syndrome: Secondary | ICD-10-CM | POA: Diagnosis not present

## 2023-02-08 DIAGNOSIS — Z6833 Body mass index (BMI) 33.0-33.9, adult: Secondary | ICD-10-CM | POA: Diagnosis not present

## 2023-02-08 DIAGNOSIS — E559 Vitamin D deficiency, unspecified: Secondary | ICD-10-CM | POA: Diagnosis not present

## 2023-02-25 DIAGNOSIS — I10 Essential (primary) hypertension: Secondary | ICD-10-CM | POA: Diagnosis not present

## 2023-02-25 DIAGNOSIS — E039 Hypothyroidism, unspecified: Secondary | ICD-10-CM | POA: Diagnosis not present

## 2023-02-25 DIAGNOSIS — E785 Hyperlipidemia, unspecified: Secondary | ICD-10-CM | POA: Diagnosis not present

## 2023-03-03 DIAGNOSIS — I10 Essential (primary) hypertension: Secondary | ICD-10-CM | POA: Diagnosis not present

## 2023-03-03 DIAGNOSIS — E782 Mixed hyperlipidemia: Secondary | ICD-10-CM | POA: Diagnosis not present

## 2023-03-03 DIAGNOSIS — Z8249 Family history of ischemic heart disease and other diseases of the circulatory system: Secondary | ICD-10-CM | POA: Diagnosis not present

## 2023-03-03 DIAGNOSIS — E039 Hypothyroidism, unspecified: Secondary | ICD-10-CM | POA: Diagnosis not present

## 2023-03-03 DIAGNOSIS — F329 Major depressive disorder, single episode, unspecified: Secondary | ICD-10-CM | POA: Diagnosis not present

## 2023-03-11 DIAGNOSIS — G629 Polyneuropathy, unspecified: Secondary | ICD-10-CM | POA: Diagnosis not present

## 2023-03-11 DIAGNOSIS — Z79899 Other long term (current) drug therapy: Secondary | ICD-10-CM | POA: Diagnosis not present

## 2023-03-11 DIAGNOSIS — E559 Vitamin D deficiency, unspecified: Secondary | ICD-10-CM | POA: Diagnosis not present

## 2023-03-11 DIAGNOSIS — E790 Hyperuricemia without signs of inflammatory arthritis and tophaceous disease: Secondary | ICD-10-CM | POA: Diagnosis not present

## 2023-03-11 DIAGNOSIS — Z6831 Body mass index (BMI) 31.0-31.9, adult: Secondary | ICD-10-CM | POA: Diagnosis not present

## 2023-03-11 DIAGNOSIS — G894 Chronic pain syndrome: Secondary | ICD-10-CM | POA: Diagnosis not present

## 2023-03-15 DIAGNOSIS — Z79899 Other long term (current) drug therapy: Secondary | ICD-10-CM | POA: Diagnosis not present

## 2023-04-06 DIAGNOSIS — G894 Chronic pain syndrome: Secondary | ICD-10-CM | POA: Diagnosis not present

## 2023-04-06 DIAGNOSIS — E559 Vitamin D deficiency, unspecified: Secondary | ICD-10-CM | POA: Diagnosis not present

## 2023-04-06 DIAGNOSIS — G629 Polyneuropathy, unspecified: Secondary | ICD-10-CM | POA: Diagnosis not present

## 2023-04-06 DIAGNOSIS — Z6830 Body mass index (BMI) 30.0-30.9, adult: Secondary | ICD-10-CM | POA: Diagnosis not present

## 2023-04-06 DIAGNOSIS — E790 Hyperuricemia without signs of inflammatory arthritis and tophaceous disease: Secondary | ICD-10-CM | POA: Diagnosis not present

## 2023-05-16 DIAGNOSIS — E790 Hyperuricemia without signs of inflammatory arthritis and tophaceous disease: Secondary | ICD-10-CM | POA: Diagnosis not present

## 2023-05-16 DIAGNOSIS — G629 Polyneuropathy, unspecified: Secondary | ICD-10-CM | POA: Diagnosis not present

## 2023-05-16 DIAGNOSIS — Z6831 Body mass index (BMI) 31.0-31.9, adult: Secondary | ICD-10-CM | POA: Diagnosis not present

## 2023-05-16 DIAGNOSIS — E559 Vitamin D deficiency, unspecified: Secondary | ICD-10-CM | POA: Diagnosis not present

## 2023-05-16 DIAGNOSIS — G894 Chronic pain syndrome: Secondary | ICD-10-CM | POA: Diagnosis not present

## 2023-06-16 DIAGNOSIS — G629 Polyneuropathy, unspecified: Secondary | ICD-10-CM | POA: Diagnosis not present

## 2023-06-16 DIAGNOSIS — G894 Chronic pain syndrome: Secondary | ICD-10-CM | POA: Diagnosis not present

## 2023-06-16 DIAGNOSIS — Z6831 Body mass index (BMI) 31.0-31.9, adult: Secondary | ICD-10-CM | POA: Diagnosis not present

## 2023-06-16 DIAGNOSIS — E559 Vitamin D deficiency, unspecified: Secondary | ICD-10-CM | POA: Diagnosis not present

## 2023-06-16 DIAGNOSIS — M129 Arthropathy, unspecified: Secondary | ICD-10-CM | POA: Diagnosis not present

## 2023-06-16 DIAGNOSIS — R5383 Other fatigue: Secondary | ICD-10-CM | POA: Diagnosis not present

## 2023-06-16 DIAGNOSIS — Z79899 Other long term (current) drug therapy: Secondary | ICD-10-CM | POA: Diagnosis not present

## 2023-06-20 DIAGNOSIS — Z79899 Other long term (current) drug therapy: Secondary | ICD-10-CM | POA: Diagnosis not present

## 2023-07-19 DIAGNOSIS — Z79899 Other long term (current) drug therapy: Secondary | ICD-10-CM | POA: Diagnosis not present

## 2023-07-19 DIAGNOSIS — R0602 Shortness of breath: Secondary | ICD-10-CM | POA: Diagnosis not present

## 2023-07-19 DIAGNOSIS — Z6830 Body mass index (BMI) 30.0-30.9, adult: Secondary | ICD-10-CM | POA: Diagnosis not present

## 2023-07-19 DIAGNOSIS — E790 Hyperuricemia without signs of inflammatory arthritis and tophaceous disease: Secondary | ICD-10-CM | POA: Diagnosis not present

## 2023-07-19 DIAGNOSIS — E875 Hyperkalemia: Secondary | ICD-10-CM | POA: Diagnosis not present

## 2023-07-19 DIAGNOSIS — R030 Elevated blood-pressure reading, without diagnosis of hypertension: Secondary | ICD-10-CM | POA: Diagnosis not present

## 2023-07-19 DIAGNOSIS — E6609 Other obesity due to excess calories: Secondary | ICD-10-CM | POA: Diagnosis not present

## 2023-07-19 DIAGNOSIS — G894 Chronic pain syndrome: Secondary | ICD-10-CM | POA: Diagnosis not present

## 2023-07-19 DIAGNOSIS — Z6831 Body mass index (BMI) 31.0-31.9, adult: Secondary | ICD-10-CM | POA: Diagnosis not present

## 2023-07-19 DIAGNOSIS — G629 Polyneuropathy, unspecified: Secondary | ICD-10-CM | POA: Diagnosis not present

## 2023-07-25 DIAGNOSIS — Z79899 Other long term (current) drug therapy: Secondary | ICD-10-CM | POA: Diagnosis not present

## 2023-07-26 DIAGNOSIS — F4322 Adjustment disorder with anxiety: Secondary | ICD-10-CM | POA: Diagnosis not present

## 2023-07-26 DIAGNOSIS — F3174 Bipolar disorder, in full remission, most recent episode manic: Secondary | ICD-10-CM | POA: Diagnosis not present

## 2023-07-26 DIAGNOSIS — F5101 Primary insomnia: Secondary | ICD-10-CM | POA: Diagnosis not present

## 2023-07-26 DIAGNOSIS — F900 Attention-deficit hyperactivity disorder, predominantly inattentive type: Secondary | ICD-10-CM | POA: Diagnosis not present

## 2023-08-01 DIAGNOSIS — Z1231 Encounter for screening mammogram for malignant neoplasm of breast: Secondary | ICD-10-CM | POA: Diagnosis not present

## 2023-08-02 DIAGNOSIS — R079 Chest pain, unspecified: Secondary | ICD-10-CM | POA: Diagnosis not present

## 2023-08-02 DIAGNOSIS — Z8249 Family history of ischemic heart disease and other diseases of the circulatory system: Secondary | ICD-10-CM | POA: Diagnosis not present

## 2023-08-02 DIAGNOSIS — E039 Hypothyroidism, unspecified: Secondary | ICD-10-CM | POA: Diagnosis not present

## 2023-08-02 DIAGNOSIS — R0789 Other chest pain: Secondary | ICD-10-CM | POA: Diagnosis not present

## 2023-08-18 DIAGNOSIS — F32A Depression, unspecified: Secondary | ICD-10-CM | POA: Diagnosis not present

## 2023-08-18 DIAGNOSIS — G894 Chronic pain syndrome: Secondary | ICD-10-CM | POA: Diagnosis not present

## 2023-08-18 DIAGNOSIS — Z9181 History of falling: Secondary | ICD-10-CM | POA: Diagnosis not present

## 2023-08-18 DIAGNOSIS — E790 Hyperuricemia without signs of inflammatory arthritis and tophaceous disease: Secondary | ICD-10-CM | POA: Diagnosis not present

## 2023-08-18 DIAGNOSIS — E6609 Other obesity due to excess calories: Secondary | ICD-10-CM | POA: Diagnosis not present

## 2023-08-18 DIAGNOSIS — Z6830 Body mass index (BMI) 30.0-30.9, adult: Secondary | ICD-10-CM | POA: Diagnosis not present

## 2023-08-18 DIAGNOSIS — G629 Polyneuropathy, unspecified: Secondary | ICD-10-CM | POA: Diagnosis not present

## 2023-08-18 DIAGNOSIS — F419 Anxiety disorder, unspecified: Secondary | ICD-10-CM | POA: Diagnosis not present

## 2023-08-18 DIAGNOSIS — Z79899 Other long term (current) drug therapy: Secondary | ICD-10-CM | POA: Diagnosis not present

## 2023-08-24 DIAGNOSIS — Z79899 Other long term (current) drug therapy: Secondary | ICD-10-CM | POA: Diagnosis not present

## 2023-09-05 DIAGNOSIS — I10 Essential (primary) hypertension: Secondary | ICD-10-CM | POA: Diagnosis not present

## 2023-09-05 DIAGNOSIS — E039 Hypothyroidism, unspecified: Secondary | ICD-10-CM | POA: Diagnosis not present

## 2023-09-05 DIAGNOSIS — E785 Hyperlipidemia, unspecified: Secondary | ICD-10-CM | POA: Diagnosis not present

## 2023-09-14 DIAGNOSIS — G629 Polyneuropathy, unspecified: Secondary | ICD-10-CM | POA: Diagnosis not present

## 2023-09-14 DIAGNOSIS — G894 Chronic pain syndrome: Secondary | ICD-10-CM | POA: Diagnosis not present

## 2023-09-14 DIAGNOSIS — Z6829 Body mass index (BMI) 29.0-29.9, adult: Secondary | ICD-10-CM | POA: Diagnosis not present

## 2023-09-14 DIAGNOSIS — Z79899 Other long term (current) drug therapy: Secondary | ICD-10-CM | POA: Diagnosis not present

## 2023-09-14 DIAGNOSIS — Z9181 History of falling: Secondary | ICD-10-CM | POA: Diagnosis not present

## 2024-06-05 ENCOUNTER — Encounter (HOSPITAL_COMMUNITY): Payer: Self-pay | Admitting: Internal Medicine
